# Patient Record
Sex: Female | Born: 1970 | Race: White | Hispanic: No | Smoking: Current every day smoker
Health system: Southern US, Community
[De-identification: ages and names within clinical notes are randomized; demographics above are authoritative.]

## PROBLEM LIST (undated history)

## (undated) DIAGNOSIS — H35069 Retinal vasculitis, unspecified eye: Secondary | ICD-10-CM

## (undated) DIAGNOSIS — K589 Irritable bowel syndrome without diarrhea: Secondary | ICD-10-CM

## (undated) DIAGNOSIS — Z832 Family history of diseases of the blood and blood-forming organs and certain disorders involving the immune mechanism: Secondary | ICD-10-CM

## (undated) DIAGNOSIS — D45 Polycythemia vera: Secondary | ICD-10-CM

## (undated) DIAGNOSIS — I82409 Acute embolism and thrombosis of unspecified deep veins of unspecified lower extremity: Secondary | ICD-10-CM

## (undated) HISTORY — DX: Polycythemia vera: D45

## (undated) HISTORY — DX: Acute embolism and thrombosis of unspecified deep veins of unspecified lower extremity: I82.409

## (undated) HISTORY — DX: Irritable bowel syndrome without diarrhea: K58.9

## (undated) HISTORY — DX: Retinal vasculitis, unspecified eye: H35.069

## (undated) HISTORY — DX: Family history of diseases of the blood and blood-forming organs and certain disorders involving the immune mechanism: Z83.2

---

## 2010-08-05 ENCOUNTER — Ambulatory Visit (HOSPITAL_BASED_OUTPATIENT_CLINIC_OR_DEPARTMENT_OTHER): Payer: BC Managed Care – PPO | Admitting: Hematology & Oncology

## 2010-08-05 ENCOUNTER — Other Ambulatory Visit: Payer: Self-pay | Admitting: Hematology & Oncology

## 2010-08-05 DIAGNOSIS — I2699 Other pulmonary embolism without acute cor pulmonale: Secondary | ICD-10-CM

## 2010-08-05 DIAGNOSIS — D45 Polycythemia vera: Secondary | ICD-10-CM

## 2010-08-05 LAB — CBC WITH DIFFERENTIAL (CANCER CENTER ONLY)
BASO#: 0 10e3/uL (ref 0.0–0.2)
BASO%: 0.4 % (ref 0.0–2.0)
EOS%: 2.2 % (ref 0.0–7.0)
Eosinophils Absolute: 0.1 10e3/uL (ref 0.0–0.5)
HCT: 38.9 % (ref 34.8–46.6)
HGB: 13.7 g/dL (ref 11.6–15.9)
LYMPH#: 1.9 10e3/uL (ref 0.9–3.3)
LYMPH%: 36.4 % (ref 14.0–48.0)
MCH: 32.5 pg (ref 26.0–34.0)
MCHC: 35.2 g/dL (ref 32.0–36.0)
MCV: 92 fL (ref 81–101)
MONO#: 0.5 10e3/uL (ref 0.1–0.9)
MONO%: 10.2 % (ref 0.0–13.0)
NEUT#: 2.6 10e3/uL (ref 1.5–6.5)
NEUT%: 50.8 % (ref 39.6–80.0)
Platelets: 211 10e3/uL (ref 145–400)
RBC: 4.22 10e6/uL (ref 3.70–5.32)
RDW: 14.3 % (ref 11.1–15.7)
WBC: 5.1 10e3/uL (ref 3.9–10.0)

## 2010-08-05 LAB — CHCC SATELLITE - SMEAR

## 2010-08-05 LAB — PROTIME-INR (CHCC SATELLITE): Protime: 18 Seconds — ABNORMAL HIGH (ref 10.6–13.4)

## 2010-08-08 LAB — ERYTHROPOIETIN: Erythropoietin: 21.6 m[IU]/mL (ref 2.6–34.0)

## 2010-08-08 LAB — IRON AND TIBC: UIBC: 428 ug/dL

## 2010-08-12 ENCOUNTER — Encounter (HOSPITAL_BASED_OUTPATIENT_CLINIC_OR_DEPARTMENT_OTHER): Payer: BC Managed Care – PPO | Admitting: Hematology & Oncology

## 2010-08-12 ENCOUNTER — Other Ambulatory Visit: Payer: Self-pay | Admitting: Hematology & Oncology

## 2010-08-12 DIAGNOSIS — I2699 Other pulmonary embolism without acute cor pulmonale: Secondary | ICD-10-CM

## 2010-08-12 DIAGNOSIS — D45 Polycythemia vera: Secondary | ICD-10-CM

## 2010-08-12 LAB — CBC WITH DIFFERENTIAL (CANCER CENTER ONLY)
BASO#: 0 10*3/uL (ref 0.0–0.2)
Eosinophils Absolute: 0.1 10*3/uL (ref 0.0–0.5)
HCT: 37.2 % (ref 34.8–46.6)
LYMPH%: 31.6 % (ref 14.0–48.0)
MCH: 32.3 pg (ref 26.0–34.0)
MCV: 92 fL (ref 81–101)
MONO%: 8.6 % (ref 0.0–13.0)
NEUT%: 58.4 % (ref 39.6–80.0)
Platelets: 181 10*3/uL (ref 145–400)
RBC: 4.05 10*6/uL (ref 3.70–5.32)

## 2010-09-16 ENCOUNTER — Ambulatory Visit: Payer: BC Managed Care – PPO | Admitting: Family Medicine

## 2010-09-16 DIAGNOSIS — Z029 Encounter for administrative examinations, unspecified: Secondary | ICD-10-CM

## 2010-10-12 ENCOUNTER — Other Ambulatory Visit: Payer: Self-pay | Admitting: Hematology & Oncology

## 2010-10-12 ENCOUNTER — Encounter (HOSPITAL_BASED_OUTPATIENT_CLINIC_OR_DEPARTMENT_OTHER): Payer: BC Managed Care – PPO | Admitting: Hematology & Oncology

## 2010-10-12 DIAGNOSIS — I2699 Other pulmonary embolism without acute cor pulmonale: Secondary | ICD-10-CM

## 2010-10-12 DIAGNOSIS — D45 Polycythemia vera: Secondary | ICD-10-CM

## 2010-10-12 DIAGNOSIS — D6859 Other primary thrombophilia: Secondary | ICD-10-CM

## 2010-10-12 LAB — CBC WITH DIFFERENTIAL (CANCER CENTER ONLY)
BASO#: 0 10*3/uL (ref 0.0–0.2)
Eosinophils Absolute: 0.1 10*3/uL (ref 0.0–0.5)
HCT: 43.2 % (ref 34.8–46.6)
HGB: 15.4 g/dL (ref 11.6–15.9)
LYMPH%: 26.5 % (ref 14.0–48.0)
MCH: 32.4 pg (ref 26.0–34.0)
MCV: 91 fL (ref 81–101)
MONO#: 0.7 10*3/uL (ref 0.1–0.9)
MONO%: 6.6 % (ref 0.0–13.0)
RBC: 4.76 10*6/uL (ref 3.70–5.32)
WBC: 10.3 10*3/uL — ABNORMAL HIGH (ref 3.9–10.0)

## 2010-10-12 LAB — FERRITIN: Ferritin: 28 ng/mL (ref 10–291)

## 2010-10-14 ENCOUNTER — Encounter (HOSPITAL_BASED_OUTPATIENT_CLINIC_OR_DEPARTMENT_OTHER): Payer: BC Managed Care – PPO | Admitting: Hematology & Oncology

## 2010-10-14 DIAGNOSIS — D45 Polycythemia vera: Secondary | ICD-10-CM

## 2010-10-14 DIAGNOSIS — I2699 Other pulmonary embolism without acute cor pulmonale: Secondary | ICD-10-CM

## 2010-10-18 ENCOUNTER — Other Ambulatory Visit: Payer: Self-pay | Admitting: Hematology & Oncology

## 2010-10-18 DIAGNOSIS — Z86718 Personal history of other venous thrombosis and embolism: Secondary | ICD-10-CM

## 2010-10-20 ENCOUNTER — Other Ambulatory Visit (HOSPITAL_BASED_OUTPATIENT_CLINIC_OR_DEPARTMENT_OTHER): Payer: BC Managed Care – PPO

## 2010-10-26 ENCOUNTER — Ambulatory Visit (HOSPITAL_BASED_OUTPATIENT_CLINIC_OR_DEPARTMENT_OTHER)
Admission: RE | Admit: 2010-10-26 | Discharge: 2010-10-26 | Disposition: A | Discharge status: Home / Self Care | Payer: BC Managed Care – PPO | Source: Ambulatory Visit | Attending: Hematology & Oncology | Admitting: Hematology & Oncology

## 2010-10-26 DIAGNOSIS — Z86718 Personal history of other venous thrombosis and embolism: Secondary | ICD-10-CM

## 2010-10-26 DIAGNOSIS — M79609 Pain in unspecified limb: Secondary | ICD-10-CM | POA: Insufficient documentation

## 2010-10-26 DIAGNOSIS — M7989 Other specified soft tissue disorders: Secondary | ICD-10-CM | POA: Insufficient documentation

## 2010-11-18 ENCOUNTER — Other Ambulatory Visit: Payer: Self-pay | Admitting: Hematology & Oncology

## 2010-11-18 ENCOUNTER — Encounter (HOSPITAL_BASED_OUTPATIENT_CLINIC_OR_DEPARTMENT_OTHER): Payer: BC Managed Care – PPO | Admitting: Hematology & Oncology

## 2010-11-18 DIAGNOSIS — Z86718 Personal history of other venous thrombosis and embolism: Secondary | ICD-10-CM

## 2010-11-18 DIAGNOSIS — D45 Polycythemia vera: Secondary | ICD-10-CM

## 2010-11-18 DIAGNOSIS — H35359 Cystoid macular degeneration, unspecified eye: Secondary | ICD-10-CM

## 2010-11-18 DIAGNOSIS — I2699 Other pulmonary embolism without acute cor pulmonale: Secondary | ICD-10-CM

## 2010-11-18 DIAGNOSIS — D6859 Other primary thrombophilia: Secondary | ICD-10-CM

## 2010-11-18 LAB — CBC WITH DIFFERENTIAL (CANCER CENTER ONLY)
BASO#: 0 10*3/uL (ref 0.0–0.2)
BASO%: 0.4 % (ref 0.0–2.0)
Eosinophils Absolute: 0.1 10*3/uL (ref 0.0–0.5)
HCT: 40.3 % (ref 34.8–46.6)
HGB: 14.2 g/dL (ref 11.6–15.9)
LYMPH#: 2.3 10*3/uL (ref 0.9–3.3)
LYMPH%: 24 % (ref 14.0–48.0)
MCV: 95 fL (ref 81–101)
MONO#: 0.7 10*3/uL (ref 0.1–0.9)
NEUT%: 67.1 % (ref 39.6–80.0)
RBC: 4.25 10*6/uL (ref 3.70–5.32)
RDW: 15.7 % (ref 11.1–15.7)
WBC: 9.6 10*3/uL (ref 3.9–10.0)

## 2010-11-18 LAB — PROTIME-INR (CHCC SATELLITE)

## 2010-11-21 ENCOUNTER — Encounter: Payer: Self-pay | Admitting: *Deleted

## 2010-11-22 ENCOUNTER — Telehealth: Payer: Self-pay | Admitting: Hematology & Oncology

## 2010-11-22 ENCOUNTER — Other Ambulatory Visit: Payer: Self-pay | Admitting: Hematology & Oncology

## 2010-11-22 ENCOUNTER — Ambulatory Visit (HOSPITAL_BASED_OUTPATIENT_CLINIC_OR_DEPARTMENT_OTHER)
Admission: RE | Admit: 2010-11-22 | Discharge: 2010-11-22 | Disposition: A | Discharge status: Home / Self Care | Payer: BC Managed Care – PPO | Source: Ambulatory Visit | Attending: Hematology & Oncology | Admitting: Hematology & Oncology

## 2010-11-22 ENCOUNTER — Ambulatory Visit (HOSPITAL_BASED_OUTPATIENT_CLINIC_OR_DEPARTMENT_OTHER): Payer: BC Managed Care – PPO

## 2010-11-22 VITALS — BP 108/72 | HR 87 | Temp 98.0°F

## 2010-11-22 DIAGNOSIS — R05 Cough: Secondary | ICD-10-CM

## 2010-11-22 DIAGNOSIS — R059 Cough, unspecified: Secondary | ICD-10-CM | POA: Insufficient documentation

## 2010-11-22 DIAGNOSIS — Z111 Encounter for screening for respiratory tuberculosis: Secondary | ICD-10-CM

## 2010-11-22 DIAGNOSIS — Z87891 Personal history of nicotine dependence: Secondary | ICD-10-CM | POA: Insufficient documentation

## 2010-11-22 MED ORDER — TUBERCULIN PPD 5 UNIT/0.1ML ID SOLN
5.0000 [IU] | Freq: Once | INTRADERMAL | Status: AC
  Administered 2010-11-22: 5 [IU] via INTRADERMAL

## 2010-11-22 NOTE — Telephone Encounter (Signed)
Entered in error

## 2010-11-22 NOTE — Progress Notes (Signed)
CC:   Lelon Perla, DO [name], M.D. Thayer Ohm, MD, Fax [phone]  DIAGNOSES: 1. Polycythemia vera. 2. Recurrent deep venous thrombosis/pulmonary embolus. 3. Factor V Leiden mutation.  CURRENT THERAPY:  Coumadin to maintain INR between 2-2.5.  INTERIM HISTORY:  [name] comes in for followup.  She is doing alright.  She has seen Dr. Luciana Axe.  He is a retinal specialist.  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  He wants to have numerous tests for this.  This, in my opinion, is not at all related to her polycythemia.  She is feeling that she has a "chest cold."  She is coughing up some purulent mucus.  She has a little bit of a temperature today.  We will go ahead and get a chest x-ray on her.  I am going to put her on a Z-Pak prescription.  She has had no chills.  There has been no bleeding.  There has been no change in bowel or bladder habits.  PHYSICAL EXAMINATION:  General Appearance:  This is a well-developed, well-nourished, white female in no obvious distress.  Vital Signs 100.3. Pulse 96.  Respiratory rate 18.  Blood pressure 104/68.  Weight is 124. Head and Neck Exam:  A normocephalic, atraumatic skull.  There are no ocular or oral lesions.  There are no palpable cervical or supraclavicular lymph nodes.  Lungs:  Clear bilaterally.  Cardiac Examination:  Regular rate and rhythm with normal S1, S2.  There are no murmurs, rubs, or bruits.  Abdominal Exam:  Soft with good bowel sounds. There is no palpable abdominal mass.  There is no fluid wave.  There is no palpable hepatosplenomegaly.  Back Exam:  No tenderness of the spine, ribs, or hips.  Extremities:  No clubbing, cyanosis, or edema. Neurological Exam:  No focal neurological deficits.  LABORATORY STUDIES:  White cell count 9.6, hemoglobin 14.2, hematocrit 40.3, platelet count 195.  Doppler test done on her legs on 10/26/2010 did not show any evidence of DVTs bilaterally.  IMPRESSION:  Ms.  Holly Shaffer is a very nice, 40 year old, white female with polycythemia.  She is JAK2 negative.  She also has factor V Leiden mutation.  Her INR is quite low today.  her INR is only 1.3.  We will have her take 5 mg of Coumadin a day.  We will go ahead and see what the chest x-ray shows.  Dr. Luciana Axe wants a lot of blood tests done for the cystoid macular edema.  We will get these blood tests.  He also wants her to have a TB test done.  We will do this on 11/21/2010.  I will plan to get her back in 2 weeks just to have an INR check.  We will then get her back in 6 weeks or so for additional lab work.  I will plan to get her back in after the holidays.    ______________________________ Holly Shaffer, M.D. PRE/MEDQ  D:  11/18/2010  T:  11/21/2010  Job:  [id number]

## 2010-11-22 NOTE — Patient Instructions (Signed)
Informed pt she needs to return in 48 to 72 hours to have her PPD read. She verbalized understanding.

## 2010-12-02 ENCOUNTER — Other Ambulatory Visit (HOSPITAL_BASED_OUTPATIENT_CLINIC_OR_DEPARTMENT_OTHER): Payer: BC Managed Care – PPO | Admitting: Lab

## 2010-12-02 ENCOUNTER — Other Ambulatory Visit: Payer: Self-pay | Admitting: Hematology & Oncology

## 2010-12-02 DIAGNOSIS — D6859 Other primary thrombophilia: Secondary | ICD-10-CM

## 2010-12-02 DIAGNOSIS — I2699 Other pulmonary embolism without acute cor pulmonale: Secondary | ICD-10-CM

## 2010-12-02 LAB — PROTIME-INR (CHCC SATELLITE): INR: 2.4 (ref 2.0–3.5)

## 2010-12-30 ENCOUNTER — Other Ambulatory Visit: Payer: BC Managed Care – PPO | Admitting: Lab

## 2010-12-30 ENCOUNTER — Other Ambulatory Visit: Payer: Self-pay | Admitting: Hematology & Oncology

## 2010-12-30 LAB — CBC WITH DIFFERENTIAL (CANCER CENTER ONLY)
BASO#: 0 10*3/uL (ref 0.0–0.2)
EOS%: 0.1 % (ref 0.0–7.0)
HCT: 41.9 % (ref 34.8–46.6)
HGB: 14.6 g/dL (ref 11.6–15.9)
LYMPH#: 1.4 10*3/uL (ref 0.9–3.3)
MCHC: 34.8 g/dL (ref 32.0–36.0)
MONO#: 0.3 10*3/uL (ref 0.1–0.9)
NEUT#: 14.7 10*3/uL — ABNORMAL HIGH (ref 1.5–6.5)
NEUT%: 89.4 % — ABNORMAL HIGH (ref 39.6–80.0)
RBC: 4.29 10*6/uL (ref 3.70–5.32)
WBC: 16.4 10*3/uL — ABNORMAL HIGH (ref 3.9–10.0)

## 2010-12-30 LAB — PROTIME-INR (CHCC SATELLITE): INR: 1.7 — ABNORMAL LOW (ref 2.0–3.5)

## 2011-01-04 ENCOUNTER — Other Ambulatory Visit: Payer: Self-pay | Admitting: *Deleted

## 2011-01-04 DIAGNOSIS — G4700 Insomnia, unspecified: Secondary | ICD-10-CM

## 2011-01-04 DIAGNOSIS — I82409 Acute embolism and thrombosis of unspecified deep veins of unspecified lower extremity: Secondary | ICD-10-CM

## 2011-01-04 MED ORDER — WARFARIN SODIUM 5 MG PO TABS: 5.0000 mg | ORAL_TABLET | Freq: Every day | ORAL | Status: DC

## 2011-01-04 MED ORDER — ZOLPIDEM TARTRATE 10 MG PO TABS: 10.0000 mg | ORAL_TABLET | Freq: Every evening | ORAL | Status: DC | PRN

## 2011-01-04 NOTE — Telephone Encounter (Signed)
Chart opened to refill long term medication.

## 2011-01-12 ENCOUNTER — Encounter: Payer: Self-pay | Admitting: *Deleted

## 2011-01-12 NOTE — Progress Notes (Signed)
Pt left message on voicemail with c/o of oral thrush and wants to know if Dr Myna Hidalgo can call in Nystatin in for her. It started after she began Prednisone 60 mg daily. Reviewed with Dr Myna Hidalgo, he doesn't recall placing her on Prednisone therefore she should call the MD who did. Spoke to pt. She said Dr Luciana Axe put her on Prednisone but "always wants to refer me back to Dr Myna Hidalgo for issues". Explained that it was common practice to have the physician who prescribed the medication mange the side effects. Also encouraged her to obtain a PCP. She verbalized understanding and will call Dr Luciana Axe.

## 2011-01-26 ENCOUNTER — Encounter: Payer: Self-pay | Admitting: Hematology & Oncology

## 2011-01-26 ENCOUNTER — Ambulatory Visit (HOSPITAL_BASED_OUTPATIENT_CLINIC_OR_DEPARTMENT_OTHER): Payer: BC Managed Care – PPO | Admitting: Hematology & Oncology

## 2011-01-26 ENCOUNTER — Other Ambulatory Visit (HOSPITAL_BASED_OUTPATIENT_CLINIC_OR_DEPARTMENT_OTHER): Payer: BC Managed Care – PPO | Admitting: Lab

## 2011-01-26 ENCOUNTER — Other Ambulatory Visit: Payer: Self-pay | Admitting: Hematology & Oncology

## 2011-01-26 DIAGNOSIS — I82409 Acute embolism and thrombosis of unspecified deep veins of unspecified lower extremity: Secondary | ICD-10-CM

## 2011-01-26 DIAGNOSIS — D6859 Other primary thrombophilia: Secondary | ICD-10-CM

## 2011-01-26 DIAGNOSIS — Z832 Family history of diseases of the blood and blood-forming organs and certain disorders involving the immune mechanism: Secondary | ICD-10-CM

## 2011-01-26 DIAGNOSIS — D45 Polycythemia vera: Secondary | ICD-10-CM | POA: Insufficient documentation

## 2011-01-26 DIAGNOSIS — H35069 Retinal vasculitis, unspecified eye: Secondary | ICD-10-CM

## 2011-01-26 HISTORY — DX: Polycythemia vera: D45

## 2011-01-26 HISTORY — DX: Acute embolism and thrombosis of unspecified deep veins of unspecified lower extremity: I82.409

## 2011-01-26 HISTORY — DX: Family history of diseases of the blood and blood-forming organs and certain disorders involving the immune mechanism: Z83.2

## 2011-01-26 HISTORY — DX: Retinal vasculitis, unspecified eye: H35.069

## 2011-01-26 LAB — CBC WITH DIFFERENTIAL (CANCER CENTER ONLY)
BASO#: 0 10*3/uL (ref 0.0–0.2)
BASO%: 0.1 % (ref 0.0–2.0)
EOS%: 0 % (ref 0.0–7.0)
HGB: 15.1 g/dL (ref 11.6–15.9)
LYMPH#: 0.7 10*3/uL — ABNORMAL LOW (ref 0.9–3.3)
MCHC: 35 g/dL (ref 32.0–36.0)
MONO#: 0.3 10*3/uL (ref 0.1–0.9)
NEUT#: 14.8 10*3/uL — ABNORMAL HIGH (ref 1.5–6.5)
RDW: 14.9 % (ref 11.1–15.7)
WBC: 15.8 10*3/uL — ABNORMAL HIGH (ref 3.9–10.0)

## 2011-01-26 LAB — PROTIME-INR (CHCC SATELLITE): INR: 1.5 — ABNORMAL LOW (ref 2.0–3.5)

## 2011-01-26 NOTE — Progress Notes (Signed)
This office note has been dictated.

## 2011-01-27 NOTE — Progress Notes (Signed)
CC:   Lelon Perla, DO [name], M.D. Thayer Ohm, M.D.  DIAGNOSES: 1. Polycythemia vera, JAK2 negative. 2. Recurrent deep vein thrombosis/pulmonary embolism. 3. Factor V Leiden mutation. 4. Retinal vasculitis.  CURRENT THERAPY: 1. Coumadin to maintain INR between 2 and 2.5. 2. Phlebotomy to maintain hematocrit less than 45%. 3. Steroids per Ophthalmology.  INTERIM HISTORY:  Holly Shaffer came in for followup.  She now is on the steroids.  She has retinal vasculitis.  She has cystoid macular edema. Dr. Luciana Axe of Ophthalmology is managing this.  He has her on prednisone. She is on 60 mg a day.  She is not happy about being on the prednisone. She feels very "bloated."  When I saw her, she had a cough.  She does smoke.  We did do a chest x- ray on her.  The chest x-ray was unremarkable.  She has some peribronchial thickening consistent with bronchitis.  We gave her a Z- Pak which did help her.  She is taking her Coumadin daily.  She alternates 5 mg with 2.5 mg.  Her INR today was only 1.5.  I told her that she needed to go up to 5 mg a day of the Coumadin.  She has a good appetite.  The steroids have increased her appetite quite a bit.  She has had no chest pain.  There has been no headache.  She has had no nausea or vomiting.  There has been no fever.  She did develop thrush. She is on Mycelex troches.  She says these do not work too well.  I did give her some samples of Oravig.  PHYSICAL EXAM:  GENERAL:  This is a slightly cushingoid-appearing white female in no obvious distress.  Vital Signs:  Temperature of 99.2, pulses 102, respiratory rate 18, blood pressure 123/82.  Weight is 129. Head and Neck Exam:  Normocephalic, atraumatic skull.  There are no ocular or oral lesions.  There are no palpable cervical or supraclavicular lymph nodes.  Lungs:  Clear bilaterally.  Cardiac Exam: Regular rate and rhythm with normal S1, S2.  There are no murmurs, rubs, or bruits.   Abdominal Exam:  Soft with good bowel sounds.  There is no palpable abdominal mass.  There is no palpable hepatosplenomegaly. Extremities:  No clubbing, cyanosis, or edema.  She has good range motion of her joints.  Skin Exam:  No rashes, ecchymosis, or petechia. Neurological Exam:  No focal neurological deficits.  LABORATORY RESULTS:  White cell count is 15.8, hemoglobin 15.1, hematocrit 43.1, platelet count 209.  INR is 1.5.  IMPRESSION:  Holly Shaffer is a 41 year old white female with polycythemia vera.  She is hypercoagulable secondary to factor V Leiden mutation.  I suspect that the polycythemia also to some degree is making her hypercoagulable.  I am still not sure what this cystoid macular edema is about.  However, Dr. Luciana Axe is doing a good job of managing this.  Ms. Jergens is waiting for an appointment with one of the ophthalmologists at [facility] that Dr. Luciana Axe is setting her up with.  We do not have to do a phlebotomy on her today.  We will have her come back to see [place] in about a month.  She will increase her Coumadin up to 5 mg a day.  I do want to see her back in 1 month.    ______________________________ Josph Macho, M.D. PRE/MEDQ  D:  01/26/2011  T:  01/26/2011  Job:  [id number]

## 2011-01-30 ENCOUNTER — Telehealth: Payer: Self-pay | Admitting: *Deleted

## 2011-01-30 DIAGNOSIS — J329 Chronic sinusitis, unspecified: Secondary | ICD-10-CM

## 2011-01-30 MED ORDER — AMOXICILLIN-POT CLAVULANATE 600-42.9 MG/5ML PO SUSR: 600.0000 mg | Freq: Two times a day (BID) | ORAL | Status: DC

## 2011-01-30 NOTE — Telephone Encounter (Signed)
Pt called with c/o "nasal and chest congestion along with a sinus infection". Wants to know if Dr Myna Hidalgo will call something in for her but "not a pill that is too big". Reviewed with Dr Myna Hidalgo. To call in Augmentin susp. Returned call to pt with instructions. She understands that it will be in a liquid form and not pills. To call back if she has problems with the medication or if her sx worsen.

## 2011-02-28 ENCOUNTER — Other Ambulatory Visit (HOSPITAL_BASED_OUTPATIENT_CLINIC_OR_DEPARTMENT_OTHER): Payer: BC Managed Care – PPO | Admitting: Lab

## 2011-02-28 ENCOUNTER — Ambulatory Visit: Payer: BC Managed Care – PPO

## 2011-02-28 ENCOUNTER — Ambulatory Visit (HOSPITAL_BASED_OUTPATIENT_CLINIC_OR_DEPARTMENT_OTHER): Payer: BC Managed Care – PPO | Admitting: Hematology & Oncology

## 2011-02-28 DIAGNOSIS — D45 Polycythemia vera: Secondary | ICD-10-CM

## 2011-02-28 DIAGNOSIS — I82409 Acute embolism and thrombosis of unspecified deep veins of unspecified lower extremity: Secondary | ICD-10-CM

## 2011-02-28 DIAGNOSIS — Z832 Family history of diseases of the blood and blood-forming organs and certain disorders involving the immune mechanism: Secondary | ICD-10-CM

## 2011-02-28 DIAGNOSIS — B370 Candidal stomatitis: Secondary | ICD-10-CM

## 2011-02-28 DIAGNOSIS — H35069 Retinal vasculitis, unspecified eye: Secondary | ICD-10-CM

## 2011-02-28 DIAGNOSIS — D6859 Other primary thrombophilia: Secondary | ICD-10-CM

## 2011-02-28 DIAGNOSIS — N390 Urinary tract infection, site not specified: Secondary | ICD-10-CM

## 2011-02-28 LAB — PROTIME-INR (CHCC SATELLITE)
INR: 2.9 (ref 2.0–3.5)
Protime: 34.8 Seconds — ABNORMAL HIGH (ref 10.6–13.4)

## 2011-02-28 LAB — CBC WITH DIFFERENTIAL (CANCER CENTER ONLY)
BASO%: 0.2 % (ref 0.0–2.0)
EOS%: 0.5 % (ref 0.0–7.0)
LYMPH#: 3.3 10*3/uL (ref 0.9–3.3)
MCHC: 35.8 g/dL (ref 32.0–36.0)
NEUT#: 6.9 10*3/uL — ABNORMAL HIGH (ref 1.5–6.5)
Platelets: 225 10*3/uL (ref 145–400)
RDW: 15.2 % (ref 11.1–15.7)
WBC: 11.3 10*3/uL — ABNORMAL HIGH (ref 3.9–10.0)

## 2011-02-28 MED ORDER — FLUCONAZOLE 100 MG PO TABS: 100.0000 mg | ORAL_TABLET | Freq: Every day | ORAL | Status: AC

## 2011-02-28 MED ORDER — SULFAMETHOXAZOLE-TRIMETHOPRIM 800-160 MG PO TABS: 1.0000 | ORAL_TABLET | Freq: Two times a day (BID) | ORAL | Status: AC

## 2011-02-28 NOTE — Progress Notes (Signed)
This office note has been dictated.

## 2011-02-28 NOTE — Progress Notes (Signed)
Pt accessed with #18 gague needle.  Blood will not flow.  Pt states she does not want to be stuck again today.  She will return tomorrow for the phlebotomy.

## 2011-03-01 ENCOUNTER — Telehealth: Payer: Self-pay | Admitting: Hematology & Oncology

## 2011-03-01 NOTE — Progress Notes (Signed)
CC:   Holly Perla, DO Holly Shaffer, M.D. Holly Ohm, MD, Fax [phone] Holly Pimple, MD  DIAGNOSES: 1. Polycythemia vera-JAK2 negative. 2. Recurrent deep venous thrombosis/pulmonary embolism. 3. Factor V Leiden mutation. 4. Retinal vasculitis.  CURRENT THERAPY: 1. Coumadin to maintain INR between 2-2.5. 2. Phlebotomy to maintain hematocrit less than 45%. 3. Steroids-currently on a taper.  INTERIM HISTORY:  Holly Shaffer comes in for followup.  She is not a "happy camper" today.  She is on steroids.  She does not like being on the steroids.  She just feels very bloated.  She is on 5 mg a day now.  She is going to be referred to Dr. Everlena Cooper at [facility].  This is for this retinal issue that she has.  She is getting injections into her eye for this vasculitis.  She I think is still smoking.  She is trying to cut back.  It is tough for her while she is on the steroids.  There has been no bleeding.  She has had no fever.  She has had a little bit of a temperature.  She did have some hematuria.  She denies any kind of dysuria.  PHYSICAL EXAM:  This is a well-developed, well-nourished white female in no obvious distress.  Vital signs:  100.2, pulse 102, respiratory rate 14, blood pressure 132/96, weight is 125.  Head and neck: Normocephalic, atraumatic skull.  There are no ocular or oral lesions. There are no palpable cervical or supraclavicular lymph nodes.  Lungs: Clear to percussion and auscultation bilaterally.  Cardiac:  Regular rate and rhythm with normal S1, S2.  There are no murmurs, rubs or bruits.  Abdomen:  Soft, she has a little bit of distention, mostly from I think abdominal fat accumulation of steroids.  There is no palpable abdominal mass.  There is no palpable hepatosplenomegaly.  Back:  No tenderness of the spine, ribs, or hips.  Extremities:  No clubbing, cyanosis or edema.  No palpable venous cords noted in the legs. Neurological:  No focal  neurological deficits.  LABORATORY STUDIES:  White cell count is 11.3, hemoglobin 15.6, hematocrit 43.6, platelet count is 225.  Her INR is 2.9.  IMPRESSION:  Holly Shaffer is a 41 year old white female with multiple issues.  She has polycythemia.  She has Factor V Leiden mutation- heterozygote.  She also has retinal vasculitis.  We will go ahead and phlebotomize her today.  She says that she just feels a little bit dizzy.  She says this happens when her blood gets "too thick".  I will take out 1 unit.  Will also give her 500 mL of fluid.  She does have a temperature today.  I am checking her urine for an infection.  I forgot to mention that she does have some thrush on her tongue.  I am going to put her on some Diflucan.  I think 100 mg a day while on steroids would be a good thing for her.  I am also going to put her on some Bactrim prophylactically.  Will put on Bactrim DS twice a day for 5 days.  I told Holly Shaffer that both these antibiotics could increase her Coumadin effect.  I want her to come in next week to have her Coumadin checked.  I will plan to get Holly Shaffer back in about a month for close followup.  I just feel bad for Holly Shaffer.  She really has a busy job and just does not have the "  time" to deal with all these other issues.    ______________________________ Josph Macho, M.D. PRE/MEDQ  D:  02/28/2011  T:  02/28/2011  Job:  [id number]

## 2011-03-01 NOTE — Telephone Encounter (Signed)
Patient called and cx 03/01/11 apt due to arm being sore.  She resch for 03/06/11

## 2011-03-23 ENCOUNTER — Other Ambulatory Visit: Payer: BC Managed Care – PPO | Admitting: Lab

## 2011-03-23 ENCOUNTER — Ambulatory Visit: Payer: BC Managed Care – PPO | Admitting: Hematology & Oncology

## 2011-03-28 ENCOUNTER — Encounter: Payer: Self-pay | Admitting: *Deleted

## 2011-03-28 NOTE — Progress Notes (Signed)
Pt left message on answering machine that she still has a "kidney infection" and she would like for Dr. Myna Hidalgo to give her another antibiotic, " one that is not too hard to swallow".  Per Dr. Myna Hidalgo, pt called, left message on pt's cell phone that the person that treated her first infection is the person she really needs to be calling.  If she wants to she can come in tomorrow and get a UA and culture.  Asked that she call us back and let us know if she is coming in tomorrow.

## 2011-04-07 ENCOUNTER — Other Ambulatory Visit: Payer: Self-pay | Admitting: *Deleted

## 2011-04-07 DIAGNOSIS — G4700 Insomnia, unspecified: Secondary | ICD-10-CM

## 2011-04-07 MED ORDER — ZOLPIDEM TARTRATE 10 MG PO TABS: 10.0000 mg | ORAL_TABLET | Freq: Every evening | ORAL | Status: DC | PRN

## 2011-04-07 NOTE — Telephone Encounter (Signed)
Received refill request for Ambien 10 mg. Reviewed records. Pt is within timeframe for refill. Called [facility] to [facility].

## 2011-04-19 ENCOUNTER — Ambulatory Visit: Payer: BC Managed Care – PPO

## 2011-04-19 ENCOUNTER — Ambulatory Visit (HOSPITAL_BASED_OUTPATIENT_CLINIC_OR_DEPARTMENT_OTHER): Payer: BC Managed Care – PPO | Admitting: Hematology & Oncology

## 2011-04-19 ENCOUNTER — Other Ambulatory Visit (HOSPITAL_BASED_OUTPATIENT_CLINIC_OR_DEPARTMENT_OTHER): Payer: BC Managed Care – PPO | Admitting: Lab

## 2011-04-19 VITALS — BP 132/87 | HR 108 | Temp 99.8°F | Ht 64.0 in | Wt 129.0 lb

## 2011-04-19 DIAGNOSIS — H35069 Retinal vasculitis, unspecified eye: Secondary | ICD-10-CM

## 2011-04-19 DIAGNOSIS — Z832 Family history of diseases of the blood and blood-forming organs and certain disorders involving the immune mechanism: Secondary | ICD-10-CM

## 2011-04-19 DIAGNOSIS — I82409 Acute embolism and thrombosis of unspecified deep veins of unspecified lower extremity: Secondary | ICD-10-CM

## 2011-04-19 DIAGNOSIS — D45 Polycythemia vera: Secondary | ICD-10-CM

## 2011-04-19 DIAGNOSIS — N390 Urinary tract infection, site not specified: Secondary | ICD-10-CM

## 2011-04-19 LAB — URINALYSIS, ROUTINE W REFLEX MICROSCOPIC
Bilirubin Urine: NEGATIVE
Glucose, UA: NEGATIVE mg/dL
Hgb urine dipstick: NEGATIVE
Ketones, ur: NEGATIVE mg/dL
Leukocytes, UA: NEGATIVE
Nitrite: NEGATIVE
Protein, ur: NEGATIVE mg/dL
Specific Gravity, Urine: 1.021 (ref 1.005–1.030)
Urobilinogen, UA: 1 mg/dL (ref 0.0–1.0)
pH: 6.5 (ref 5.0–8.0)

## 2011-04-19 LAB — CBC WITH DIFFERENTIAL (CANCER CENTER ONLY)
BASO#: 0 10e3/uL (ref 0.0–0.2)
BASO%: 0.2 % (ref 0.0–2.0)
EOS%: 0.3 % (ref 0.0–7.0)
Eosinophils Absolute: 0 10e3/uL (ref 0.0–0.5)
HCT: 43.4 % (ref 34.8–46.6)
HGB: 15.1 g/dL (ref 11.6–15.9)
LYMPH#: 1.7 10e3/uL (ref 0.9–3.3)
LYMPH%: 15.6 % (ref 14.0–48.0)
MCH: 36 pg — ABNORMAL HIGH (ref 26.0–34.0)
MCHC: 34.8 g/dL (ref 32.0–36.0)
MCV: 104 fL — ABNORMAL HIGH (ref 81–101)
MONO#: 0.6 10e3/uL (ref 0.1–0.9)
MONO%: 5.6 % (ref 0.0–13.0)
NEUT#: 8.8 10e3/uL — ABNORMAL HIGH (ref 1.5–6.5)
NEUT%: 78.3 % (ref 39.6–80.0)
Platelets: 236 10e3/uL (ref 145–400)
RBC: 4.19 10e6/uL (ref 3.70–5.32)
RDW: 13.4 % (ref 11.1–15.7)
WBC: 11.2 10e3/uL — ABNORMAL HIGH (ref 3.9–10.0)

## 2011-04-19 LAB — PROTIME-INR (CHCC SATELLITE): INR: 1.2 — ABNORMAL LOW (ref 2.0–3.5)

## 2011-04-19 MED ORDER — RIVAROXABAN 20 MG PO TABS: 20.0000 mg | ORAL_TABLET | Freq: Every day | ORAL | Status: DC

## 2011-04-19 NOTE — Progress Notes (Signed)
This office note has been dictated.

## 2011-04-19 NOTE — Progress Notes (Signed)
CC:   Holly Perla, DO [name], MD [name], M.D.  DIAGNOSES: 1. Polycythemia vera-JAK2 negative. 2. Recurrent deep venous thrombosis/pulmonary embolism secondary to     factor V Leiden mutation. 3. Retinal vasculitis.  CURRENT THERAPY: 1. The patient to be transitioned from Coumadin to Xarelto for ease of     anticoagulation therapy. 2. Phlebotomy to maintain hematocrit less than 45%.  INTERVAL HISTORY:  Holly Shaffer comes in for followup.  She is still having issues with this vasculitis.  She sees Dr. Everlena Cooper at [facility].  He has her on a steroid taper.  She tried this before.  Unfortunately, she had a flare up.  As such, she is on another steroid taper.  She really hates being on prednisone.  It just makes her feel bloated. She is also smoking because the prednisone just really gets her irritable.  She is trying to cut back.  She has Chantix but does not want to take this until she is off the prednisone.  She has had no headache.  She is working full time.  Her appetite is okay.  She has had some changes in her bowel and bladder habits.  There is some urinary frequency.  She has had no rashes.  She has had no pruritus.  PHYSICAL EXAM:  General: This is a slightly cushingoid-appearing white female in no obvious distress.  Vital signs: Temperature 99.8, pulse 108, respiratory rate 18, blood pressure is 132/87, and weight is 129. Head and neck exam shows a normocephalic, atraumatic skull.  There are no ocular or oral lesions.  Again, she has a slight cushingoid appearance to her face.  There is no adenopathy in her neck.  Lungs: Clear bilaterally.  Cardiac examination:  Regular rhythm with a normal S1 and S2.  There are no murmurs, rubs, or bruits.  Abdominal exam: Soft with good bowel sounds.  There is no palpable abdominal mass.  There is no palpable hepatosplenomegaly.  Extremities: Shows no clubbing, cyanosis, or edema.  She has good range of motion of her joints.   Skin exam: No rashes, ecchymosis, or petechia.  Neurological exam: Shows no focal neurological deficits.  LABORATORY STUDIES:  White cell count is 11, hemoglobin 15, hematocrit 43.4, and platelet count is 236. Her INR is 1.2.  IMPRESSION:  Holly Shaffer is a 41 year old white female whose main problem right now is retinal vasculitis.  It is unclear as to what this is coming from.  I would not think it is from the polycythemia.  I am going to switch her over to Xarelto.  Her INR is only 1.2.  She really is not happy with having to have her blood work done all the time.  She also is not too happy with having to watch what she eats.  I told her that Xarelto would be a good idea for her, as this has been shown to be as effective as Coumadin without the inconvenience.  I do not see a problem with her being on Xarelto.  We did go ahead and try to phlebotomize her today.  She says that she was a keeping her hemoglobin below 15.  We had difficulties with this. I think that were okay not having her phlebotomize, as her hematocrit was only 43.  We will plan to get her back to see [place] in another month or so.    ______________________________ Josph Macho, M.D. PRE/MEDQ  D:  04/19/2011  T:  04/19/2011  Job:  [id number]

## 2011-04-19 NOTE — Progress Notes (Signed)
Patient added on for phlebotomy, after one attempt per Holly Shaffer, patient refused to be re-cannulated. Patient pulled arm back when attempting to cannulate.  Dr. Myna Hidalgo notified.  Teola Bradley, Adriannah Steinkamp [employer]

## 2011-04-20 ENCOUNTER — Telehealth: Payer: Self-pay | Admitting: Hematology & Oncology

## 2011-04-20 LAB — IRON AND TIBC: TIBC: 368 ug/dL (ref 250–470)

## 2011-04-20 NOTE — Telephone Encounter (Signed)
Pt aware of 05-15-11 appointment 

## 2011-05-04 ENCOUNTER — Other Ambulatory Visit: Payer: Self-pay | Admitting: Hematology & Oncology

## 2011-05-04 DIAGNOSIS — N390 Urinary tract infection, site not specified: Secondary | ICD-10-CM

## 2011-05-04 DIAGNOSIS — J329 Chronic sinusitis, unspecified: Secondary | ICD-10-CM

## 2011-05-04 MED ORDER — AMOXICILLIN-POT CLAVULANATE 600-42.9 MG/5ML PO SUSR: 600.0000 mg | Freq: Two times a day (BID) | ORAL | Status: AC

## 2011-05-15 ENCOUNTER — Ambulatory Visit: Payer: BC Managed Care – PPO | Admitting: Hematology & Oncology

## 2011-05-15 ENCOUNTER — Other Ambulatory Visit: Payer: BC Managed Care – PPO | Admitting: Lab

## 2011-06-23 ENCOUNTER — Ambulatory Visit (HOSPITAL_BASED_OUTPATIENT_CLINIC_OR_DEPARTMENT_OTHER): Payer: BC Managed Care – PPO

## 2011-06-23 ENCOUNTER — Other Ambulatory Visit (HOSPITAL_BASED_OUTPATIENT_CLINIC_OR_DEPARTMENT_OTHER): Payer: BC Managed Care – PPO | Admitting: Lab

## 2011-06-23 ENCOUNTER — Ambulatory Visit (HOSPITAL_BASED_OUTPATIENT_CLINIC_OR_DEPARTMENT_OTHER): Payer: BC Managed Care – PPO | Admitting: Hematology & Oncology

## 2011-06-23 VITALS — BP 105/77 | HR 77 | Temp 97.0°F

## 2011-06-23 VITALS — BP 117/82 | HR 97 | Temp 98.2°F | Ht 64.0 in | Wt 127.0 lb

## 2011-06-23 DIAGNOSIS — I82409 Acute embolism and thrombosis of unspecified deep veins of unspecified lower extremity: Secondary | ICD-10-CM

## 2011-06-23 DIAGNOSIS — D45 Polycythemia vera: Secondary | ICD-10-CM

## 2011-06-23 DIAGNOSIS — I2699 Other pulmonary embolism without acute cor pulmonale: Secondary | ICD-10-CM

## 2011-06-23 DIAGNOSIS — D6859 Other primary thrombophilia: Secondary | ICD-10-CM

## 2011-06-23 DIAGNOSIS — Z832 Family history of diseases of the blood and blood-forming organs and certain disorders involving the immune mechanism: Secondary | ICD-10-CM

## 2011-06-23 DIAGNOSIS — H35069 Retinal vasculitis, unspecified eye: Secondary | ICD-10-CM

## 2011-06-23 LAB — CBC WITH DIFFERENTIAL (CANCER CENTER ONLY)
BASO#: 0 10*3/uL (ref 0.0–0.2)
BASO%: 0.3 % (ref 0.0–2.0)
HCT: 46.4 % (ref 34.8–46.6)
HGB: 16.5 g/dL — ABNORMAL HIGH (ref 11.6–15.9)
LYMPH#: 2.3 10*3/uL (ref 0.9–3.3)
MONO#: 0.5 10*3/uL (ref 0.1–0.9)
NEUT%: 57.6 % (ref 39.6–80.0)
RDW: 12.3 % (ref 11.1–15.7)
WBC: 7 10*3/uL (ref 3.9–10.0)

## 2011-06-23 NOTE — Progress Notes (Signed)
DIAGNOSES: 1. Polycythemia vera, JAK2-negative. 2. Factor V Leiden mutation, heterozygous, with recurrent deep vein     thrombosis/pulmonary embolus. 3. Retinal vasculitis.  CURRENT THERAPY: 1. Xarelto 20 mg p.o. daily. 2. Phlebotomy to maintain hemoglobin below 14. 3. Patient to start low-dose aspirin. 4. The patient is status post steroids for a cystoid macular edema.  INTERIM HISTORY:  Ms. Korell comes in for her followup.  She feels tired.  She probably needs to be phlebotomized.  She just has little energy.  She says that she gets some swelling in her legs.  She usually sees [place] when her blood count gets up a little bit too high.  She is on Xarelto.  She is tolerating Xarelto fairly well.  I think that we probably should consider low-dose aspirin for her.  I think this might be a good idea with respect to the arterial side of her circulation issues.  She has had no bleeding.  She has had no change in bowel or bladder habits.  She has had some issues with some dysuria.  She has had no rashes.  She has had no nausea.  She is still smoking but trying to cut back.  She is on Chantix.  On her last lab studies, her ferritin was 50 with an saturation of 38%. Her last D-dimer was 0.26.  PHYSICAL EXAMINATION:  This is a well-developed, well-nourished white female in no obvious distress.  Vital signs:  A temperature of 98.2, pulse 97, respiratory rate 18, blood pressure 117/82.  Weight is 127. Head and neck:  A normocephalic, atraumatic skull.  She has some facial plethora.  There is some slight conjunctival inflammation.  She has no adenopathy in her neck.  Lungs:  Clear bilaterally.  Cardiac:  Regular rate and rhythm with a normal S1 and S2.  There are no murmurs, rubs or bruits.  Abdomen:  Soft with good bowel sounds.  There is no palpable abdominal mass.  There is no fluid wave.  There is no palpable hepatosplenomegaly.  Extremities:  No clubbing, cyanosis or  edema. Neurologic:  No focal neurological deficits.  LABORATORY STUDIES:  White cell count is 17, hemoglobin 16.5,hematocrit 46.4, platelet count 199.  IMPRESSION:  Ms. Brancato is a 41 year old white female with polycythemia.  She also has a factor V Leiden mutation.  She is heterozygous for this.  She is on Xarelto.  We will go ahead and phlebotomize her today.  We will then have her come back in 1 week for another CBC and probable phlebotomy.  We will plan to see her back ourselves in about a month.    ______________________________ Josph Macho, M.D. PRE/MEDQ  D:  06/23/2011  T:  06/23/2011  Job:  [id number]

## 2011-06-23 NOTE — Progress Notes (Signed)
Holly Shaffer presents today for phlebotomy per MD orders. Phlebotomy procedure started at0925 and ended at 0940 Approximately 500 mls removed. Patient observed for 30 minutes after procedure without any incident. Patient tolerated procedure well. IV needle removed intact.

## 2011-06-23 NOTE — Progress Notes (Signed)
This office note has been dictated.

## 2011-06-23 NOTE — Patient Instructions (Signed)
Therapeutic Phlebotomy Therapeutic phlebotomy is the controlled removal of blood from your body for the purpose of treating a medical condition. It is similar to donating blood. Usually, about a pint (470 mL) of blood is removed. The average adult has 9 to 12 pints (4.3 to 5.7 L) of blood. Therapeutic phlebotomy may be used to treat the following medical conditions:  Hemochromatosis. This is a condition in which there is too much iron in the blood.   Polycythemia vera. This is a condition in which there are too many red cells in the blood.   Porphyria cutanea tarda. This is a disease usually passed from one generation to the next (inherited). It is a condition in which an important part of hemoglobin is not made properly. This results in the build up of abnormal amounts of porphyrins in the body.   Sickle cell disease. This is an inherited disease. It is a condition in which the red blood cells form an abnormal crescent shape rather than a round shape.  LET YOUR CAREGIVER KNOW ABOUT:  Allergies.   Medicines taken including herbs, eyedrops, over-the-counter medicines, and creams.   Use of steroids (by mouth or creams).   Previous problems with anesthetics or numbing medicine.   History of blood clots.   History of bleeding or blood problems.   Previous surgery.   Possibility of pregnancy, if this applies.  RISKS AND COMPLICATIONS This is a simple and safe procedure. Problems are unlikely. However, problems can occur and may include:  Nausea or lightheadedness.   Low blood pressure.   Soreness, bleeding, swelling, or bruising at the needle insertion site.   Infection.  BEFORE THE PROCEDURE  This is a procedure that can be done as an outpatient. Confirm the time that you need to arrive for your procedure. Confirm whether there is a need to fast or withhold any medications. It is helpful to wear clothing with sleeves that can be raised above the elbow. A blood sample may be done  to determine the amount of red blood cells or iron in your blood. Plan ahead of time to have someone drive you home after the procedure. PROCEDURE The entire procedure from preparation through recovery takes about 1 hour. The actual collection takes about 10 to 15 minutes.  A needle will be inserted into your vein.   Tubing and a collection bag will be attached to that needle.   Blood will flow through the needle and tubing into the collection bag.   You may be asked to open and close your hand slowly and continuously during the entire collection.   Once the specified amount of blood has been removed from your body, the collection bag and tubing will be clamped.   The needle will be removed.   Pressure will be held on the site of the needle insertion to stop the bleeding. Then a bandage will be placed over the needle insertion site.  AFTER THE PROCEDURE  Your recovery will be assessed and monitored. If there are no problems, as an outpatient, you should be able to go home shortly after the procedure.  Document Released: 06/06/2010 Document Revised: 12/22/2010 Document Reviewed: 06/06/2010 [employer] Patient Information 2012 [employer]. 

## 2011-06-24 LAB — IRON AND TIBC
Iron: 64 ug/dL (ref 42–145)
UIBC: 338 ug/dL (ref 125–400)

## 2011-06-29 ENCOUNTER — Other Ambulatory Visit: Payer: BC Managed Care – PPO | Admitting: Lab

## 2011-06-30 ENCOUNTER — Other Ambulatory Visit: Payer: BC Managed Care – PPO | Admitting: Lab

## 2011-07-12 ENCOUNTER — Other Ambulatory Visit: Payer: Self-pay | Admitting: *Deleted

## 2011-07-12 DIAGNOSIS — G4700 Insomnia, unspecified: Secondary | ICD-10-CM

## 2011-07-12 MED ORDER — ZOLPIDEM TARTRATE 10 MG PO TABS: 10.0000 mg | ORAL_TABLET | Freq: Every evening | ORAL | Status: DC | PRN

## 2011-07-12 NOTE — Telephone Encounter (Signed)
Received refill request for Ambien 10 mg. Reviewed records. Pt is within timeframe for refill. [facility] [phone].

## 2011-07-19 ENCOUNTER — Other Ambulatory Visit (HOSPITAL_BASED_OUTPATIENT_CLINIC_OR_DEPARTMENT_OTHER): Payer: BC Managed Care – PPO | Admitting: Lab

## 2011-07-19 ENCOUNTER — Ambulatory Visit (HOSPITAL_BASED_OUTPATIENT_CLINIC_OR_DEPARTMENT_OTHER): Payer: BC Managed Care – PPO

## 2011-07-19 ENCOUNTER — Ambulatory Visit (HOSPITAL_BASED_OUTPATIENT_CLINIC_OR_DEPARTMENT_OTHER): Payer: BC Managed Care – PPO | Admitting: Hematology & Oncology

## 2011-07-19 VITALS — BP 104/77 | HR 80 | Temp 97.5°F

## 2011-07-19 VITALS — BP 103/76 | HR 91 | Temp 98.7°F | Ht 64.0 in | Wt 124.0 lb

## 2011-07-19 DIAGNOSIS — Z832 Family history of diseases of the blood and blood-forming organs and certain disorders involving the immune mechanism: Secondary | ICD-10-CM

## 2011-07-19 DIAGNOSIS — I2699 Other pulmonary embolism without acute cor pulmonale: Secondary | ICD-10-CM

## 2011-07-19 DIAGNOSIS — Z7901 Long term (current) use of anticoagulants: Secondary | ICD-10-CM

## 2011-07-19 DIAGNOSIS — D45 Polycythemia vera: Secondary | ICD-10-CM

## 2011-07-19 DIAGNOSIS — D6859 Other primary thrombophilia: Secondary | ICD-10-CM

## 2011-07-19 DIAGNOSIS — H3552 Pigmentary retinal dystrophy: Secondary | ICD-10-CM

## 2011-07-19 LAB — CBC WITH DIFFERENTIAL (CANCER CENTER ONLY)
BASO%: 0.2 % (ref 0.0–2.0)
EOS%: 1.3 % (ref 0.0–7.0)
HCT: 46.8 % — ABNORMAL HIGH (ref 34.8–46.6)
LYMPH#: 2 10*3/uL (ref 0.9–3.3)
MCHC: 35.7 g/dL (ref 32.0–36.0)
MONO#: 0.7 10*3/uL (ref 0.1–0.9)
NEUT#: 6 10*3/uL (ref 1.5–6.5)
NEUT%: 67.7 % (ref 39.6–80.0)
Platelets: 215 10*3/uL (ref 145–400)
RDW: 12.6 % (ref 11.1–15.7)
WBC: 8.9 10*3/uL (ref 3.9–10.0)

## 2011-07-19 LAB — D-DIMER, QUANTITATIVE: D-Dimer, Quant: 0.27 ug/mL-FEU (ref 0.00–0.48)

## 2011-07-19 NOTE — Patient Instructions (Signed)

## 2011-07-19 NOTE — Progress Notes (Signed)
This office note has been dictated.

## 2011-07-19 NOTE — Progress Notes (Signed)
CC:   [name], MD Lelon Perla, DO [name], M.D.  DIAGNOSES: 1. Polycythemia vera, JAK2 negative. 2. Recurrent DVT, PE, heterozygous for factor V Leiden. 3. 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  CURRENT THERAPY: 1. Xarelto 20 mg p.o. daily. 2. Phlebotomy to maintain hemoglobin below 14. 3. Aspirin 81 mg p.o. daily.  INTERIM HISTORY:  [name] comes in for followup.  She still feels tired.  She feels she probably needs to be phlebotomized.  She was last phlebotomized about a month ago.  She is on Xarelto.  She is doing well with Xarelto.  We did start low- dose aspirin on her.  I felt this would be a safe and also very helpful with respect to any type of embolic issues on the "arterial side."  She is still working.  She is having a more difficult time at work because of the fatigue.  Her D-dimer was 0.27 last saw her a month ago.  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  She sees Dr. Everlena Cooper at [facility] for this.  From what Holly Shaffer says, he will be sending off some lab work on her to try to help better identified the source of this vasculitis within the eye.  She is still smoking.  She is trying to cut back.  PHYSICAL EXAMINATION:  This is a well-developed, well-nourished white female in no obvious distress.  Vital signs:  98.7, pulse 91, respiratory rate 18, blood pressure 103/76.  Weight is 124.  Head and neck:  Normocephalic, atraumatic skull.  There are no ocular or oral lesions.  There are no  palpable cervical or supraclavicular lymph nodes.  Lungs:  Clear bilaterally.  Cardiac:  Regular rate and rhythm with a normal S1 and S2.  There are no murmurs, rubs or bruits. Abdomen:  Soft with good bowel sounds.  There is no palpable abdominal mass.  There is no fluid wave.  There is no palpable hepatosplenomegaly. Back: No tenderness over the spine, ribs, or hips.  Extremities:  No clubbing, cyanosis or edema.  She has good range motion of her  joints. Skin:  Exam does show a ruddy complexion to her skin.  Neurological:  No focal neurological deficits.  LABORATORY STUDIES:  White cell count 8.9, hemoglobin 16.7, hematocrit 46.8, platelet count 215.  MCV is 100.  IMPRESSION:  Holly Shaffer is a 41 year old white female with polycythemia.  She also is hypercoagulable secondary to factor V Leiden mutation.  She is on lifelong Xarelto.  She just feels better with the hemoglobin below 14.  I will set her up with phlebotomies for 3 weeks in a row.  Hopefully, this will get her blood down and help improve rheology of her blood.  We will plan to get her back to see [place] in another 6 weeks.  Again, we will continue to be aggressive with phlebotomizing her.  Hopefully they will be able to help with this CME.    ______________________________ Josph Macho, M.D. PRE/MEDQ  D:  07/19/2011  T:  07/19/2011  Job:  [id number]

## 2011-07-19 NOTE — Progress Notes (Signed)
Holly Shaffer presents today for phlebotomy per MD orders. Phlebotomy procedure started at 0955 and ended at 1010.500 ml removed. Patient observed for 30 minutes after procedure without any incident. Patient tolerated procedure well. IV needle removed intact. See vital sign on doc flowsheets.

## 2011-07-21 ENCOUNTER — Ambulatory Visit: Payer: BC Managed Care – PPO | Admitting: Hematology & Oncology

## 2011-07-21 ENCOUNTER — Other Ambulatory Visit: Payer: BC Managed Care – PPO | Admitting: Lab

## 2011-07-27 ENCOUNTER — Ambulatory Visit: Payer: BC Managed Care – PPO

## 2011-07-27 NOTE — Progress Notes (Signed)
Holly Shaffer presents today for phlebotomy per MD orders. Phlebotomy procedure started at 1545, cannulated x 2, needle clotted both times.  Rescheduled for next week.  Instructed to take baby aspirin as ordered. Teola Bradley, Gabriele Zwilling [employer]

## 2011-07-27 NOTE — Patient Instructions (Signed)
[place] [address]  This test is done to determine whether you have an inherited gene mutation that increases your risk of developing venous blood clots. This test is done when you have had an unexplained clotting episode, especially when you are relatively young (less than 40 years old) and have no other identified risk factors. PREPARATION FOR TEST A blood sample is obtained by inserting a needle into a vein in the arm. NORMAL FINDINGS No genetic defect is found (negative). Ranges for normal findings may vary among different laboratories and hospitals. You should always check with your doctor after having lab work or other tests done to discuss the meaning of your test results and whether your values are considered within normal limits. MEANING OF TEST  Your caregiver will go over the test results with you and discuss the importance and meaning of your results, as well as treatment options and the need for additional tests if necessary. OBTAINING THE TEST RESULTS  It is your responsibility to obtain your test results. Ask the lab or department performing the test when and how you will get your results. Document Released: 02/05/2004 Document Revised: 12/22/2010 Document Reviewed: 12/13/2007 [facility] Patient Information 2012 [place], [place].Polycythemia Vera  Polycythemia Holly Shaffer is a condition in which the body makes too many red blood cells and there is no known cause. The red blood cells (erythrocytes) are the cells which carry the oxygen in your blood stream to the cells of your body. Because of the increased red blood cells, the blood becomes thicker and does not circulate as well. It would be similar to your car having oil which is too thick so it cannot start and circulate as well. When the blood is too thick it often causes headaches and dizziness. It may also cause blood clots. Even though the blood clots easier, these patients bleed easier. The bleeding is caused because the blood cells  which help stop bleeding (platelets) do not function normally. It occurs in all age groups but is more common in the 41 to 79 year age range. TREATMENT  The treatment of polycythemia vera for many years has been blood removal (phlebotomy) which is similar to blood removal in a blood bank, however this blood is not used for donation. Hydroxyurea is used to supplement phlebotomy. Aspirin is commonly given to thin the blood as long as the patient does not have a problem with bleeding. Other drugs are used based on the progression of the disease. Document Released: 09/27/2000 Document Revised: 12/22/2010 Document Reviewed: 04/03/2008 [facility] Patient Information 2012 [place], [place].

## 2011-08-01 ENCOUNTER — Ambulatory Visit (HOSPITAL_BASED_OUTPATIENT_CLINIC_OR_DEPARTMENT_OTHER): Payer: BC Managed Care – PPO

## 2011-08-01 VITALS — BP 97/74 | HR 77 | Temp 97.9°F

## 2011-08-01 DIAGNOSIS — D45 Polycythemia vera: Secondary | ICD-10-CM

## 2011-08-01 NOTE — Progress Notes (Signed)
Holly Shaffer presents today for phlebotomy per MD orders. Phlebotomy procedure started at 0845 and ended at 0855. 500 ml removed. Patient observed for 30 minutes after procedure without any incident. Patient tolerated procedure well. IV needle removed intact. See Doc Flowsheets for vitals sign

## 2011-08-01 NOTE — Patient Instructions (Signed)

## 2011-08-04 ENCOUNTER — Ambulatory Visit (HOSPITAL_BASED_OUTPATIENT_CLINIC_OR_DEPARTMENT_OTHER): Payer: BC Managed Care – PPO

## 2011-08-04 VITALS — BP 104/72 | HR 68

## 2011-08-04 DIAGNOSIS — I82409 Acute embolism and thrombosis of unspecified deep veins of unspecified lower extremity: Secondary | ICD-10-CM

## 2011-08-04 DIAGNOSIS — H35069 Retinal vasculitis, unspecified eye: Secondary | ICD-10-CM

## 2011-08-04 DIAGNOSIS — Z832 Family history of diseases of the blood and blood-forming organs and certain disorders involving the immune mechanism: Secondary | ICD-10-CM

## 2011-08-04 DIAGNOSIS — D45 Polycythemia vera: Secondary | ICD-10-CM

## 2011-08-04 MED ORDER — SODIUM CHLORIDE 0.9 % IV SOLN: Freq: Once | INTRAVENOUS | Status: DC

## 2011-08-04 NOTE — Patient Instructions (Addendum)
Therapeutic Phlebotomy Therapeutic phlebotomy is the controlled removal of blood from your body for the purpose of treating a medical condition. It is similar to donating blood. Usually, about a pint (470 mL) of blood is removed. The average adult has 9 to 12 pints (4.3 to 5.7 L) of blood. Therapeutic phlebotomy may be used to treat the following medical conditions:  Hemochromatosis. This is a condition in which there is too much iron in the blood.   Polycythemia vera. This is a condition in which there are too many red cells in the blood.   Porphyria cutanea tarda. This is a disease usually passed from one generation to the next (inherited). It is a condition in which an important part of hemoglobin is not made properly. This results in the build up of abnormal amounts of porphyrins in the body.   Sickle cell disease. This is an inherited disease. It is a condition in which the red blood cells form an abnormal crescent shape rather than a round shape.  LET YOUR CAREGIVER KNOW ABOUT:  Allergies.   Medicines taken including herbs, eyedrops, over-the-counter medicines, and creams.   Use of steroids (by mouth or creams).   Previous problems with anesthetics or numbing medicine.   History of blood clots.   History of bleeding or blood problems.   Previous surgery.   Possibility of pregnancy, if this applies.  RISKS AND COMPLICATIONS This is a simple and safe procedure. Problems are unlikely. However, problems can occur and may include:  Nausea or lightheadedness.   Low blood pressure.   Soreness, bleeding, swelling, or bruising at the needle insertion site.   Infection.  BEFORE THE PROCEDURE  This is a procedure that can be done as an outpatient. Confirm the time that you need to arrive for your procedure. Confirm whether there is a need to fast or withhold any medications. It is helpful to wear clothing with sleeves that can be raised above the elbow. A blood sample may be done  to determine the amount of red blood cells or iron in your blood. Plan ahead of time to have someone drive you home after the procedure. PROCEDURE The entire procedure from preparation through recovery takes about 1 hour. The actual collection takes about 10 to 15 minutes.  A needle will be inserted into your vein.   Tubing and a collection bag will be attached to that needle.   Blood will flow through the needle and tubing into the collection bag.   You may be asked to open and close your hand slowly and continuously during the entire collection.   Once the specified amount of blood has been removed from your body, the collection bag and tubing will be clamped.   The needle will be removed.   Pressure will be held on the site of the needle insertion to stop the bleeding. Then a bandage will be placed over the needle insertion site.  AFTER THE PROCEDURE  Your recovery will be assessed and monitored. If there are no problems, as an outpatient, you should be able to go home shortly after the procedure.  Document Released: 06/06/2010 Document Revised: 12/22/2010 Document Reviewed: 06/06/2010 [employer] Patient Information 2012 [employer]. 

## 2011-08-04 NOTE — Progress Notes (Signed)
Holly Shaffer presents today for phlebotomy per MD orders. Phlebotomy procedure started at 1445and ended at 1500.  500 cc removed. Patient tolerated procedure well. IV needle removed intact.

## 2011-09-01 ENCOUNTER — Telehealth: Payer: Self-pay | Admitting: Hematology & Oncology

## 2011-09-01 ENCOUNTER — Ambulatory Visit: Payer: BC Managed Care – PPO | Admitting: Medical

## 2011-09-01 ENCOUNTER — Other Ambulatory Visit: Payer: BC Managed Care – PPO | Admitting: Lab

## 2011-09-01 NOTE — Telephone Encounter (Signed)
Patient called to cancel appointment and stated they would call on Monday (09/04/11) to reschedule

## 2011-10-19 ENCOUNTER — Other Ambulatory Visit (HOSPITAL_BASED_OUTPATIENT_CLINIC_OR_DEPARTMENT_OTHER): Payer: BC Managed Care – PPO | Admitting: Lab

## 2011-10-19 ENCOUNTER — Ambulatory Visit (HOSPITAL_BASED_OUTPATIENT_CLINIC_OR_DEPARTMENT_OTHER): Payer: BC Managed Care – PPO | Admitting: Hematology & Oncology

## 2011-10-19 ENCOUNTER — Ambulatory Visit: Payer: BC Managed Care – PPO

## 2011-10-19 VITALS — BP 104/68 | HR 81 | Temp 98.1°F | Resp 18 | Ht 64.0 in | Wt 127.0 lb

## 2011-10-19 DIAGNOSIS — H35069 Retinal vasculitis, unspecified eye: Secondary | ICD-10-CM

## 2011-10-19 DIAGNOSIS — D45 Polycythemia vera: Secondary | ICD-10-CM

## 2011-10-19 DIAGNOSIS — Z832 Family history of diseases of the blood and blood-forming organs and certain disorders involving the immune mechanism: Secondary | ICD-10-CM

## 2011-10-19 DIAGNOSIS — I82409 Acute embolism and thrombosis of unspecified deep veins of unspecified lower extremity: Secondary | ICD-10-CM

## 2011-10-19 DIAGNOSIS — D869 Sarcoidosis, unspecified: Secondary | ICD-10-CM

## 2011-10-19 DIAGNOSIS — D6859 Other primary thrombophilia: Secondary | ICD-10-CM

## 2011-10-19 LAB — CBC WITH DIFFERENTIAL (CANCER CENTER ONLY)
BASO%: 0.3 % (ref 0.0–2.0)
HCT: 39.8 % (ref 34.8–46.6)
LYMPH%: 34.6 % (ref 14.0–48.0)
MCH: 30.3 pg (ref 26.0–34.0)
MCHC: 34.2 g/dL (ref 32.0–36.0)
MCV: 89 fL (ref 81–101)
MONO#: 0.7 10*3/uL (ref 0.1–0.9)
MONO%: 9.2 % (ref 0.0–13.0)
NEUT%: 53.8 % (ref 39.6–80.0)
Platelets: 225 10*3/uL (ref 145–400)
RDW: 16.3 % — ABNORMAL HIGH (ref 11.1–15.7)
WBC: 8 10*3/uL (ref 3.9–10.0)

## 2011-10-19 LAB — PROTIME-INR (CHCC SATELLITE): Protime: 13.2 Seconds (ref 10.6–13.4)

## 2011-10-19 NOTE — Progress Notes (Signed)
This office note has been dictated.

## 2011-10-19 NOTE — Progress Notes (Signed)
Hct 39.8, no phlebotomy per Dr. Myna Hidalgo. Holly Shaffer, Holly Shaffer [employer]

## 2011-10-20 NOTE — Progress Notes (Signed)
CC:   Holly Perla, DO [name], MD [name], M.D.  DIAGNOSES: 1. Polycythemia vera. 2. Recurrent deep vein thrombosis/pulmonary embolus secondary to     factor V Leiden mutation, heterozygote. 3. Autoimmune retinal vasculitis. 4. 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  CURRENT THERAPY: 1. Xarelto 20 mg p.o. daily. 2. Aspirin 81 mg p.o. daily. 3. Phlebotomy to maintain a hemoglobin below 14.  INTERIM HISTORY:  Holly Shaffer comes in for followup.  She is not doing too well.  She is really stiff.  She is being evaluated at [facility].  They want to start her on azathioprine.  I have no problems with this.  I do not see how this is going to be a negative for her.  This actually may help the polycythemia and slow down her marrow and decrease her hemoglobin.  She is also going to need to be on prednisone.  This is all for this autoimmune retinal issue that she has.  She was told that she may have sarcoid.  This, I think, would be a little unusual as sarcoid really is an [place] American disease, although there are some Caucasians who have it.  I think she is doing well with the Xarelto and aspirin.  I do not see that hypercoagulability is an issue right now.  She is smoking.  She is trying to cut back on this.  She is still working.  There is a lot of stress at work.  PHYSICAL EXAMINATION:  This is a well-developed, well-nourished white female in no obvious distress.  Vital signs:  Temperature 98.7, pulse 81, respiratory rate 18, blood pressure 104/68.  Weight is 127.  Head and neck:  Normocephalic, atraumatic skull.  There are no ocular or oral lesions.  There are no palpable cervical or supraclavicular lymph nodes. Lungs:  Clear bilaterally.  Cardiac:  Regular rate and rhythm with a normal S1 and S2.  There are no murmurs, rubs or bruits.  Abdomen:  Soft with good bowel sounds.  There is no palpable abdominal mass.  There is no fluid wave.  There is no palpable hepatosplenomegaly.  Back:   No tenderness over the spine, ribs, or hips.  Extremities:  No clubbing, cyanosis or edema.  She has good range of motion of her joints.  She has some stiffness in her arms and legs.  Neurologic:  No focal neurological deficits.  LABORATORY STUDIES:  White cell count 8, hemoglobin 13.6, hematocrit 39.8, platelet count 225.  IMPRESSION:  Holly Shaffer is a 41 year old white female with polycythemia.  Again, she is JAK2-negative.  She does not need be phlebotomized today.  Again, I would be amazed if she had sarcoid.  I guess may be sarcoid might explain some of this autoimmune stuff going on.  She will continue on the Xarelto and aspirin.  I think this is a good combination for her to help with hypercoagulability.  Again, I do not see any problems with her being on azathioprine.  I do want to see her back in a month.  I think we are going to have to stay in close contact with her while she is having these changes made to her medications.    ______________________________ Josph Macho, M.D. PRE/MEDQ  D:  10/19/2011  T:  10/20/2011  Job:  [id number]

## 2011-10-23 ENCOUNTER — Other Ambulatory Visit: Payer: Self-pay | Admitting: Hematology & Oncology

## 2011-10-26 ENCOUNTER — Other Ambulatory Visit: Payer: Self-pay | Admitting: Hematology & Oncology

## 2011-10-26 ENCOUNTER — Other Ambulatory Visit: Payer: Self-pay | Admitting: *Deleted

## 2011-10-30 ENCOUNTER — Other Ambulatory Visit: Payer: Self-pay | Admitting: Hematology & Oncology

## 2011-11-24 ENCOUNTER — Ambulatory Visit (HOSPITAL_BASED_OUTPATIENT_CLINIC_OR_DEPARTMENT_OTHER): Payer: BC Managed Care – PPO | Admitting: Hematology & Oncology

## 2011-11-24 ENCOUNTER — Other Ambulatory Visit (HOSPITAL_BASED_OUTPATIENT_CLINIC_OR_DEPARTMENT_OTHER): Payer: BC Managed Care – PPO | Admitting: Lab

## 2011-11-24 ENCOUNTER — Ambulatory Visit: Payer: BC Managed Care – PPO

## 2011-11-24 VITALS — BP 130/80 | HR 84 | Temp 98.3°F | Resp 18 | Ht 64.0 in | Wt 133.0 lb

## 2011-11-24 DIAGNOSIS — H35069 Retinal vasculitis, unspecified eye: Secondary | ICD-10-CM

## 2011-11-24 DIAGNOSIS — D6859 Other primary thrombophilia: Secondary | ICD-10-CM

## 2011-11-24 DIAGNOSIS — D45 Polycythemia vera: Secondary | ICD-10-CM

## 2011-11-24 DIAGNOSIS — Z7901 Long term (current) use of anticoagulants: Secondary | ICD-10-CM

## 2011-11-24 DIAGNOSIS — Z832 Family history of diseases of the blood and blood-forming organs and certain disorders involving the immune mechanism: Secondary | ICD-10-CM

## 2011-11-24 DIAGNOSIS — D869 Sarcoidosis, unspecified: Secondary | ICD-10-CM

## 2011-11-24 LAB — CHCC SATELLITE - SMEAR

## 2011-11-24 LAB — CBC WITH DIFFERENTIAL (CANCER CENTER ONLY)
BASO%: 0.1 % (ref 0.0–2.0)
LYMPH%: 26.4 % (ref 14.0–48.0)
MCH: 30.3 pg (ref 26.0–34.0)
MCHC: 34.1 g/dL (ref 32.0–36.0)
MCV: 89 fL (ref 81–101)
MONO%: 7.1 % (ref 0.0–13.0)
Platelets: 263 10*3/uL (ref 145–400)
RDW: 20.1 % — ABNORMAL HIGH (ref 11.1–15.7)

## 2011-11-24 NOTE — Progress Notes (Signed)
This office note has been dictated.

## 2011-11-24 NOTE — Progress Notes (Signed)
Patient not phlebotomized today because she had a meeting to go to.  Phlebotomy rescheduled to Monday 11/27/11

## 2011-11-25 NOTE — Progress Notes (Signed)
CC:   Holly Perla, DO Holly Pimple, MD Holly Shaffer, M.D.  DIAGNOSES: 1. Polycythemia vera. 2. Recurrent deep venous thrombosis/pulmonary embolism. 3. Heterozygote factor V Leiden mutation. 4. Autoimmune retinal vasculitis.  CURRENT THERAPY: 1. Xarelto 20 mg p.o. daily. 2. Aspirin 81 mg p.o. daily. 3. Phlebotomy to maintain hemoglobin below 14.  INTERIM HISTORY:  Holly Shaffer comes in for followup.  She is on quite a bit of steroids.  She is having visual problems.  She started at 60 mg a day of prednisone.  She said that her vision did get better.  She has not yet started azathioprine.  She is not sure she wants to start this.  She has had no headache.  She has had no cough.  She is still smoking but cutting back to less than a pack a day.  She is under a lot of stress at work.  She is actually trying to look for another job that may not be as stressful.  PHYSICAL EXAMINATION:  General:  This is a well-developed, well- nourished white female in no obvious distress.  She may be a little cushingoid.  Vital signs:  Show temperature 98.3, pulse 84, respiratory rate 18, blood pressure 130/80.  Weight is 133.  Head and neck:  Shows a normocephalic, atraumatic skull.  There are no ocular or oral lesions. There are no palpable cervical or supraclavicular lymph nodes.  Lungs: Clear bilaterally.  There are no rales, wheezes or rhonchi.  Cardiac: Regular rate and rhythm with a normal S1, S2.  There are no murmurs, rubs or bruits.  Abdomen:  Soft with good bowel sounds.  There is no palpable abdominal mass.  There is no palpable hepatosplenomegaly. Back:  No tenderness over the spine, ribs or hips.  Extremities:  Show no clubbing, cyanosis or edema.  LABORATORY STUDIES:  White cell count is 19 (but the patient is on prednisone), hemoglobin 14.5, hematocrit 42.5, platelet count 263.  MCV is 89.  IMPRESSION:  Holly Shaffer is a very nice 41 year old white female with multiple  hematologic issues.  She also has an autoimmune problem with her eyes.  She likes to have her hemoglobin below 14.  She cannot be phlebotomized today.  We will get this set up for next week.  She continues on the Xarelto and aspirin.  I believe that these are essential for her.  I am checking her for sarcoidosis.  I am sending off an ACE level on her.  We will plan to get her back in, in about a month or so.  We really need to keep close tabs on her while she is having all these active issues.    ______________________________ Josph Macho, M.D. PRE/MEDQ  D:  11/24/2011  T:  11/25/2011  Job:  [id number]

## 2011-12-07 ENCOUNTER — Ambulatory Visit (HOSPITAL_BASED_OUTPATIENT_CLINIC_OR_DEPARTMENT_OTHER): Payer: BC Managed Care – PPO

## 2011-12-07 VITALS — BP 136/80 | HR 88 | Temp 97.0°F | Resp 20

## 2011-12-07 DIAGNOSIS — H35069 Retinal vasculitis, unspecified eye: Secondary | ICD-10-CM

## 2011-12-07 DIAGNOSIS — I82409 Acute embolism and thrombosis of unspecified deep veins of unspecified lower extremity: Secondary | ICD-10-CM

## 2011-12-07 DIAGNOSIS — Z832 Family history of diseases of the blood and blood-forming organs and certain disorders involving the immune mechanism: Secondary | ICD-10-CM

## 2011-12-07 DIAGNOSIS — D45 Polycythemia vera: Secondary | ICD-10-CM

## 2011-12-07 NOTE — Patient Instructions (Signed)

## 2011-12-07 NOTE — Progress Notes (Signed)
Holly Shaffer presents today for phlebotomy per MD orders. Phlebotomy procedure started at 1251 and ended at 1257. Approximately 500 mls removed. Patient observed for 30 minutes after procedure without any incident. Patient tolerated procedure well. IV needle removed intact.

## 2011-12-25 ENCOUNTER — Ambulatory Visit: Payer: BC Managed Care – PPO | Admitting: Hematology & Oncology

## 2011-12-25 ENCOUNTER — Other Ambulatory Visit: Payer: BC Managed Care – PPO | Admitting: Lab

## 2011-12-25 ENCOUNTER — Telehealth: Payer: Self-pay | Admitting: Hematology & Oncology

## 2011-12-25 NOTE — Telephone Encounter (Signed)
Pt moved 12-9 to 12-13. She is aware phlebotomy is at 10:30 am

## 2011-12-26 ENCOUNTER — Telehealth: Payer: Self-pay | Admitting: Hematology & Oncology

## 2011-12-26 NOTE — Telephone Encounter (Signed)
Due to MD being out, i cx 12/29/11 apt and resch for 01/02/12.  i called patient and gave reason for cancellation and gave resch date/time of apt on 01/02/12.  Patient is aware of apt

## 2011-12-29 ENCOUNTER — Ambulatory Visit: Payer: BC Managed Care – PPO | Admitting: Hematology & Oncology

## 2011-12-29 ENCOUNTER — Other Ambulatory Visit: Payer: BC Managed Care – PPO | Admitting: Lab

## 2012-01-02 ENCOUNTER — Ambulatory Visit: Payer: BC Managed Care – PPO | Admitting: Medical

## 2012-01-02 ENCOUNTER — Ambulatory Visit (HOSPITAL_BASED_OUTPATIENT_CLINIC_OR_DEPARTMENT_OTHER): Payer: BC Managed Care – PPO | Admitting: Medical

## 2012-01-02 ENCOUNTER — Ambulatory Visit: Payer: BC Managed Care – PPO

## 2012-01-02 ENCOUNTER — Other Ambulatory Visit (HOSPITAL_BASED_OUTPATIENT_CLINIC_OR_DEPARTMENT_OTHER): Payer: BC Managed Care – PPO | Admitting: Lab

## 2012-01-02 ENCOUNTER — Other Ambulatory Visit: Payer: BC Managed Care – PPO | Admitting: Lab

## 2012-01-02 VITALS — BP 132/88 | HR 96 | Temp 98.6°F | Resp 16 | Wt 133.0 lb

## 2012-01-02 DIAGNOSIS — Z7901 Long term (current) use of anticoagulants: Secondary | ICD-10-CM

## 2012-01-02 DIAGNOSIS — Z86718 Personal history of other venous thrombosis and embolism: Secondary | ICD-10-CM

## 2012-01-02 DIAGNOSIS — D45 Polycythemia vera: Secondary | ICD-10-CM

## 2012-01-02 DIAGNOSIS — I82409 Acute embolism and thrombosis of unspecified deep veins of unspecified lower extremity: Secondary | ICD-10-CM

## 2012-01-02 DIAGNOSIS — Z86711 Personal history of pulmonary embolism: Secondary | ICD-10-CM

## 2012-01-02 LAB — CBC WITH DIFFERENTIAL (CANCER CENTER ONLY)
BASO%: 0.2 % (ref 0.0–2.0)
HCT: 39.4 % (ref 34.8–46.6)
LYMPH%: 28 % (ref 14.0–48.0)
MCHC: 34.5 g/dL (ref 32.0–36.0)
MCV: 95 fL (ref 81–101)
MONO#: 1 10*3/uL — ABNORMAL HIGH (ref 0.1–0.9)
NEUT%: 64.2 % (ref 39.6–80.0)
RDW: 16.4 % — ABNORMAL HIGH (ref 11.1–15.7)
WBC: 13.4 10*3/uL — ABNORMAL HIGH (ref 3.9–10.0)

## 2012-01-02 NOTE — Progress Notes (Signed)
No phlebotomy need today per m.d order

## 2012-01-02 NOTE — Progress Notes (Signed)
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 #3 heterozygote factor V Leiden mutation. #4 autoimmune retinal vasculitis.  Current therapy: #1 Xarelto 20 mg by mouth daily. #2 aspirin 81 mg by mouth daily. #3 phlebotomy to maintain hemoglobin less than 14.  Interim history: Holly Shaffer presents today for an office followup visit.  Overall, she, reports, that she's doing relatively well.  She still complains of some fatigue, but is able to perform her activities of daily living without any hindrance or decline.  She is on 10 mg of steroids.  She is also on a steroid eyedrop.  Her visual acuity has improved.  She remains on Xarelto without any problems.  She does not report any bleeding symptoms.  She has a good appetite.  She denies any nausea, vomiting, diarrhea, constipation, chest pain, shortness of breath, or cough.  She denies any fevers, chills, or night sweats.  She denies any obvious, abnormal bleeding.  She denies any headaches or rashes.  Her hemoglobin is 13.6.  As such, she will not need a phlebotomy today.  Of note, the last, time, we saw her.  We did send off an ACE level, which was normal at 15.  Review of Systems: Constitutional:Negative for malaise/fatigue, fever, chills, weight loss, diaphoresis, activity change, appetite change, and unexpected weight change.  HEENT: Negative for double vision, blurred vision, visual loss, ear pain, tinnitus, congestion, rhinorrhea, epistaxis sore throat or sinus disease, oral pain/lesion, tongue soreness Respiratory: Negative for cough, chest tightness, shortness of breath, wheezing and stridor.  Cardiovascular: Negative for chest pain, palpitations, leg swelling, orthopnea, PND, DOE or claudication Gastrointestinal: Negative for nausea, vomiting, abdominal pain, diarrhea, constipation, blood in stool, melena, hematochezia, abdominal distention, anal bleeding, rectal pain, anorexia and hematemesis.   Genitourinary: Negative for dysuria, frequency, hematuria,  Musculoskeletal: Negative for myalgias, back pain, joint swelling, arthralgias and gait problem.  Skin: Negative for rash, color change, pallor and wound.  Neurological:. Negative for dizziness/light-headedness, tremors, seizures, syncope, facial asymmetry, speech difficulty, weakness, numbness, headaches and paresthesias.  Hematological: Negative for adenopathy. Does not bruise/bleed easily.  Psychiatric/Behavioral:  Negative for depression, no loss of interest in normal activity or change in sleep pattern.   Physical Exam: This is a 41 year old, well-developed, well-nourished, white female, in no obvious distress Vitals: Temperature 90.6 degrees, pulse 96, respirations 16, blood pressure 132/80.  Weight 133 pounds HEENT reveals a normocephalic, atraumatic skull, no scleral icterus, no oral lesions  Neck is supple without any cervical or supraclavicular adenopathy.  Lungs are clear to auscultation bilaterally. There are no wheezes, rales or rhonci Cardiac is regular rate and rhythm with a normal S1 and S2. There are no murmurs, rubs, or bruits.  Abdomen is soft with good bowel sounds, there is no palpable mass. There is no palpable hepatosplenomegaly. There is no palpable fluid wave.  Musculoskeletal no tenderness of the spine, ribs, or hips.  Extremities there are no clubbing, cyanosis, or edema.  Skin no petechia, purpura or ecchymosis Neurologic is nonfocal.  Laboratory Data: White count 13.4, hemoglobin 13.6, hematocrit 39.4, platelets 279,000  Current Outpatient Prescriptions on File Prior to Visit  Medication Sig Dispense Refill  . predniSONE (DELTASONE) 10 MG tablet Take 10 mg by mouth daily. 11-24-11 started with 50 mg. Daily. 11-15- will be 40 mg. And taper down 10 mg. Every week.      Carlena Hurl 20 MG TABS TAKE 20 MG BY MOUTH DAILY AT 12 NOON.  30 tablet  6  . zolpidem (AMBIEN) 10  MG tablet TAKE 1 TABLET BY MOUTH AT  BEDTIME AS NEEDED FOR SLEEP  30 tablet  2   Assessment/Plan: This is a pleasant, 41 year old, white female, with the following issues:  #1.  Polycythemia vera.  Her hemoglobin is below 14.  Today, as, such, we will not phlebotomize her.  #2.  Recurrent deep venous thrombosis/pulmonary embolism.  She remains on Xarelto 20 mg daily  #3.  Autoimmune retinal vasculitis.  She remains on prednisone 10 mg daily.  She is also on eyedrops.  #4.  Followup.  We will follow back up with Holly Shaffer one month, but before then should there be questions or concerns.  Marland Kitchen

## 2012-01-31 ENCOUNTER — Other Ambulatory Visit: Payer: Self-pay | Admitting: Hematology & Oncology

## 2012-02-02 ENCOUNTER — Ambulatory Visit (HOSPITAL_BASED_OUTPATIENT_CLINIC_OR_DEPARTMENT_OTHER): Payer: BC Managed Care – PPO | Admitting: Medical

## 2012-02-02 ENCOUNTER — Ambulatory Visit: Payer: BC Managed Care – PPO

## 2012-02-02 ENCOUNTER — Other Ambulatory Visit (HOSPITAL_BASED_OUTPATIENT_CLINIC_OR_DEPARTMENT_OTHER): Payer: BC Managed Care – PPO | Admitting: Lab

## 2012-02-02 VITALS — BP 122/74 | HR 86 | Temp 98.4°F | Resp 16 | Wt 133.0 lb

## 2012-02-02 DIAGNOSIS — I82409 Acute embolism and thrombosis of unspecified deep veins of unspecified lower extremity: Secondary | ICD-10-CM

## 2012-02-02 DIAGNOSIS — Z86711 Personal history of pulmonary embolism: Secondary | ICD-10-CM

## 2012-02-02 DIAGNOSIS — Z832 Family history of diseases of the blood and blood-forming organs and certain disorders involving the immune mechanism: Secondary | ICD-10-CM

## 2012-02-02 DIAGNOSIS — D45 Polycythemia vera: Secondary | ICD-10-CM

## 2012-02-02 DIAGNOSIS — Z86718 Personal history of other venous thrombosis and embolism: Secondary | ICD-10-CM

## 2012-02-02 DIAGNOSIS — H35069 Retinal vasculitis, unspecified eye: Secondary | ICD-10-CM

## 2012-02-02 LAB — CBC WITH DIFFERENTIAL (CANCER CENTER ONLY)
BASO#: 0 10*3/uL (ref 0.0–0.2)
Eosinophils Absolute: 0.1 10*3/uL (ref 0.0–0.5)
HGB: 14.5 g/dL (ref 11.6–15.9)
MCH: 32.6 pg (ref 26.0–34.0)
MCV: 96 fL (ref 81–101)
MONO#: 0.6 10*3/uL (ref 0.1–0.9)
MONO%: 7.2 % (ref 0.0–13.0)
NEUT#: 5.4 10*3/uL (ref 1.5–6.5)
Platelets: 236 10*3/uL (ref 145–400)
RBC: 4.45 10*6/uL (ref 3.70–5.32)
WBC: 8.2 10*3/uL (ref 3.9–10.0)

## 2012-02-02 LAB — IRON AND TIBC
%SAT: 6 % — ABNORMAL LOW (ref 20–55)
Iron: 30 ug/dL — ABNORMAL LOW (ref 42–145)
TIBC: 475 ug/dL — ABNORMAL HIGH (ref 250–470)
UIBC: 445 ug/dL — ABNORMAL HIGH (ref 125–400)

## 2012-02-02 LAB — CHCC SATELLITE - SMEAR

## 2012-02-02 LAB — FERRITIN: Ferritin: 13 ng/mL (ref 10–291)

## 2012-02-02 NOTE — Progress Notes (Signed)
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 #3 heterozygote factor V Leiden mutation. #4 autoimmune retinal vasculitis.  Current therapy: #1 Xarelto 20 mg by mouth daily. #2 aspirin 81 mg by mouth daily. #3 phlebotomy to maintain hemoglobin less than 14.  Interim history: Holly Shaffer presents today for an office followup visit.  Overall, she, reports, that she's doing relatively well.  She still complains of some fatigue, but is able to perform her activities of daily living without any hindrance or decline  She remains on prednisone and eye drops for her autoimmune retinal vasculitis.  She did followup with rheumatologist one time at [facility], however, there is no followup.  We will see if we can make a referral to local rheumatologist.  Her visual acuity has improved somewhat.  She remains on Xarelto without any problems.  She does not report any bleeding symptoms.  She has a good appetite.  She denies any nausea, vomiting, diarrhea, constipation, chest pain, shortness of breath, or cough.  She denies any fevers, chills, or night sweats.  She denies any obvious, abnormal bleeding.  She denies any headaches or rashes.  Her hemoglobin is 14.5, as such, she will need a phlebotomy today.     Review of Systems: Constitutional:Negative for malaise/fatigue, fever, chills, weight loss, diaphoresis, activity change, appetite change, and unexpected weight change.  HEENT: Negative for double vision, blurred vision, visual loss, ear pain, tinnitus, congestion, rhinorrhea, epistaxis sore throat or sinus disease, oral pain/lesion, tongue soreness Respiratory: Negative for cough, chest tightness, shortness of breath, wheezing and stridor.  Cardiovascular: Negative for chest pain, palpitations, leg swelling, orthopnea, PND, DOE or claudication Gastrointestinal: Negative for nausea, vomiting, abdominal pain, diarrhea, constipation, blood in stool, melena, hematochezia,  abdominal distention, anal bleeding, rectal pain, anorexia and hematemesis.  Genitourinary: Negative for dysuria, frequency, hematuria,  Musculoskeletal: Negative for myalgias, back pain, joint swelling, arthralgias and gait problem.  Skin: Negative for rash, color change, pallor and wound.  Neurological:. Negative for dizziness/light-headedness, tremors, seizures, syncope, facial asymmetry, speech difficulty, weakness, numbness, headaches and paresthesias.  Hematological: Negative for adenopathy. Does not bruise/bleed easily.  Psychiatric/Behavioral:  Negative for depression, no loss of interest in normal activity or change in sleep pattern.   Physical Exam: This is a 42 year old, well-developed, well-nourished, white female, in no obvious distress Vitals: Temperature 98.4 degrees, pulse 86, respirations 16, blood pressure 122/74 weight 133 pounds HEENT reveals a normocephalic, atraumatic skull, no scleral icterus, no oral lesions  Neck is supple without any cervical or supraclavicular adenopathy.  Lungs are clear to auscultation bilaterally. There are no wheezes, rales or rhonci Cardiac is regular rate and rhythm with a normal S1 and S2. There are no murmurs, rubs, or bruits.  Abdomen is soft with good bowel sounds, there is no palpable mass. There is no palpable hepatosplenomegaly. There is no palpable fluid wave.  Musculoskeletal no tenderness of the spine, ribs, or hips.  Extremities there are no clubbing, cyanosis, or edema.  Skin no petechia, purpura or ecchymosis Neurologic is nonfocal.  Laboratory Data: White count 8.2, hemoglobin 14.5, hematocrit 42.6, platelets 236,000  Current Outpatient Prescriptions on File Prior to Visit  Medication Sig Dispense Refill  . prednisoLONE acetate (PRED FORTE) 1 % ophthalmic suspension       . XARELTO 20 MG TABS TAKE 20 MG BY MOUTH DAILY AT 12 NOON.  30 tablet  6  . zolpidem (AMBIEN) 10 MG tablet TAKE 1 TABLET BY MOUTH AT BEDTIME AS NEEDED FOR  SLEEP  30  tablet  2    Assessment/Plan: This is a pleasant, 42 year old, white female, with the following issues:  #1.  Polycythemia vera.  Her hemoglobin is above 14 today, as, such, we will  phlebotomize her.  We like to maintain her hemoglobin below 14.  #2.  Recurrent deep venous thrombosis/pulmonary embolism.  She remains on Xarelto 20 mg daily  #3.  Autoimmune retinal vasculitis.  She remains on her prednisone and eye drops.  She will continue to followup with the retinal specialist, down at [facility].  I am going to see if we can't make a local referral to rheumatologist here for her.  #4.  Followup.  We will follow back up with Holly Shaffer one month, but before then should there be questions or concerns.  Marland Kitchen

## 2012-02-02 NOTE — Progress Notes (Signed)
Holly Shaffer presents today for phlebotomy per MD orders. Phlebotomy procedure started at 1020 and ended at 1025 0 grams removed.  Unable to cannulate, patient requests to return next week.  Patient tolerated procedure well. IV needle removed intact.

## 2012-02-05 ENCOUNTER — Telehealth: Payer: Self-pay | Admitting: Hematology & Oncology

## 2012-02-05 ENCOUNTER — Other Ambulatory Visit: Payer: Self-pay | Admitting: Medical

## 2012-02-05 NOTE — Telephone Encounter (Signed)
Faxed referral to [facility] at [facility] fax [phone] to schedule pt with Dr. Corliss Skains rheumatology.

## 2012-02-08 ENCOUNTER — Telehealth: Payer: Self-pay | Admitting: Hematology & Oncology

## 2012-02-08 NOTE — Telephone Encounter (Signed)
Talked to [facility] at Dr. Fatima Sanger office there computers are down but she thinks pt has been scheduled and will let me know.

## 2012-02-09 ENCOUNTER — Telehealth: Payer: Self-pay | Admitting: Hematology & Oncology

## 2012-02-09 NOTE — Telephone Encounter (Signed)
Holly Shaffer called stating they were waiting on records from [facility] that pt is suppose to get for them. Pt is aware and is talking to them to schedule appointment

## 2012-02-23 ENCOUNTER — Ambulatory Visit: Payer: BC Managed Care – PPO

## 2012-02-23 ENCOUNTER — Telehealth: Payer: Self-pay | Admitting: Hematology & Oncology

## 2012-02-23 NOTE — Progress Notes (Signed)
Pt here today for phlebotomy, venipuncture x 2 without results. To return Monday for phlebotomy. Teola Bradley, Jaslyn Bansal [employer]

## 2012-02-23 NOTE — Telephone Encounter (Signed)
Patient called stating she could not make her 12 noon apt.  Per Amy to sch anytime before 3:30.  Patient said she could be her around 3pm.  Apt was sch for 2:45

## 2012-02-26 ENCOUNTER — Ambulatory Visit: Payer: BC Managed Care – PPO

## 2012-02-26 NOTE — Progress Notes (Signed)
Patient did not show up for her appt.

## 2012-03-04 ENCOUNTER — Other Ambulatory Visit: Payer: BC Managed Care – PPO | Admitting: Lab

## 2012-03-04 ENCOUNTER — Ambulatory Visit: Payer: BC Managed Care – PPO | Admitting: Hematology & Oncology

## 2012-03-05 ENCOUNTER — Other Ambulatory Visit: Payer: BC Managed Care – PPO | Admitting: Lab

## 2012-03-05 ENCOUNTER — Telehealth: Payer: Self-pay | Admitting: Hematology & Oncology

## 2012-03-05 ENCOUNTER — Ambulatory Visit (HOSPITAL_BASED_OUTPATIENT_CLINIC_OR_DEPARTMENT_OTHER): Payer: BC Managed Care – PPO | Admitting: Hematology & Oncology

## 2012-03-05 VITALS — BP 133/79 | HR 78 | Temp 98.2°F | Resp 16 | Ht 64.0 in | Wt 133.0 lb

## 2012-03-05 DIAGNOSIS — H35069 Retinal vasculitis, unspecified eye: Secondary | ICD-10-CM

## 2012-03-05 DIAGNOSIS — D45 Polycythemia vera: Secondary | ICD-10-CM

## 2012-03-05 DIAGNOSIS — D6859 Other primary thrombophilia: Secondary | ICD-10-CM

## 2012-03-05 DIAGNOSIS — I82409 Acute embolism and thrombosis of unspecified deep veins of unspecified lower extremity: Secondary | ICD-10-CM

## 2012-03-05 DIAGNOSIS — Z86718 Personal history of other venous thrombosis and embolism: Secondary | ICD-10-CM

## 2012-03-05 DIAGNOSIS — Z832 Family history of diseases of the blood and blood-forming organs and certain disorders involving the immune mechanism: Secondary | ICD-10-CM

## 2012-03-05 LAB — IRON AND TIBC
%SAT: 62 % — ABNORMAL HIGH (ref 20–55)
Iron: 280 ug/dL — ABNORMAL HIGH (ref 42–145)
TIBC: 451 ug/dL (ref 250–470)

## 2012-03-05 LAB — CBC WITH DIFFERENTIAL (CANCER CENTER ONLY)
BASO%: 0.2 % (ref 0.0–2.0)
LYMPH#: 1.8 10*3/uL (ref 0.9–3.3)
MONO#: 0.6 10*3/uL (ref 0.1–0.9)
NEUT#: 7.8 10*3/uL — ABNORMAL HIGH (ref 1.5–6.5)
Platelets: 212 10*3/uL (ref 145–400)
RDW: 13.9 % (ref 11.1–15.7)
WBC: 10.4 10*3/uL — ABNORMAL HIGH (ref 3.9–10.0)

## 2012-03-05 LAB — D-DIMER, QUANTITATIVE: D-Dimer, Quant: 0.27 ug/mL-FEU (ref 0.00–0.48)

## 2012-03-05 LAB — CHCC SATELLITE - SMEAR

## 2012-03-05 LAB — FERRITIN: Ferritin: 24 ng/mL (ref 10–291)

## 2012-03-05 NOTE — Progress Notes (Signed)
DIAGNOSES: 1. Polycythemia vera. 2. Factor V Leiden mutation, heterozygote. 3. History of recurrent deep venous thrombosis/pulmonary embolism. 4. Retinal vasculitis, autoimmune.  CURRENT THERAPY: 1. Xarelto 20 mg p.o. daily. 2. Aspirin 81 mg p.o. daily. 3. Phlebotomy to maintain hemoglobin less than 14.  INTERIM HISTORY:  [name] comes in for her followup.  We last saw her back in January.  She has been seeing a rheumatologist for this autoimmune issue that she has.  She is not sure who she is going to be seeing.  She has been seen out at [facility].  She had been on steroids. She is off steroids right now.  They recommended Imuran but she does not want to take this.  She gets phlebotomized pretty much every 4-6 weeks.  I think she was last phlebotomized back in January.  She definitely is iron deficient.  Her last ferritin was 13.  Her last D-dimer was 0.29 back in January.  She is doing well with the Xarelto and aspirin.  She is still smoking but trying to cut back.  PHYSICAL EXAMINATION:  General:  This is a well-developed, well- nourished white female in no obvious distress.  Vital signs:  Show a temperature of 98.2, pulse 78, respiratory rate 16, blood pressure 133/79.  Weight is 133 pounds.  Head and neck:  Shows a normocephalic, atraumatic skull.  There are no ocular or oral lesions.  She may have a little bit of a cushingoid face from her steroids.  There is no adenopathy in the neck.  Lungs:  Clear bilaterally.  There are no rales, wheeze or rhonchi.  Cardiac:  Regular rate and rhythm with a normal S1 and S2.  There are no murmurs, rubs or bruits.  Abdomen:  Soft with good bowel sounds.  There is no palpable abdominal mass.  There is no fluid wave.  There is no palpable hepatosplenomegaly.  Back:  No tenderness over the spine, ribs or hips.  Extremities:  Show no clubbing, cyanosis or edema.  Neurological:  Shows no focal neurological deficits.   Skin: No rashes, ecchymoses or petechiae.  LABORATORY STUDIES:  White cell count 10.4, hemoglobin 14.4, hematocrit 41.9, platelet count 212.  IMPRESSION:  [name] is a very nice 42 year old white female with polycythemia.  She also has a factor V Leiden mutation.  She is on lifelong anticoagulation.  Her biggest issue, however, is this retinal vasculitis.  She is going to go see a local rheumatologist to try to help manage this.  We will go ahead and phlebotomize her this week.  She feels better with her hemoglobin below 14.  We will plan to get her back in another 4-6 weeks.  I do not see that we need to do any blood work in-between visits.    ______________________________ Josph Macho, M.D. PRE/MEDQ  D:  03/05/2012  T:  03/05/2012  Job:  [id number]

## 2012-03-05 NOTE — Telephone Encounter (Signed)
Patient called and change 03/05/12 apt time from 8:30 to 10:45 due to waking up late

## 2012-03-05 NOTE — Telephone Encounter (Signed)
Patient cx 03/05/12 phlebotomy apt and stated she will resch the apt later

## 2012-03-05 NOTE — Progress Notes (Signed)
This office note has been dictated.

## 2012-03-08 ENCOUNTER — Ambulatory Visit (HOSPITAL_BASED_OUTPATIENT_CLINIC_OR_DEPARTMENT_OTHER): Payer: BC Managed Care – PPO

## 2012-03-08 VITALS — BP 120/84 | HR 115 | Temp 97.4°F | Resp 20

## 2012-03-08 DIAGNOSIS — H35069 Retinal vasculitis, unspecified eye: Secondary | ICD-10-CM

## 2012-03-08 DIAGNOSIS — Z832 Family history of diseases of the blood and blood-forming organs and certain disorders involving the immune mechanism: Secondary | ICD-10-CM

## 2012-03-08 DIAGNOSIS — D45 Polycythemia vera: Secondary | ICD-10-CM

## 2012-03-08 NOTE — Patient Instructions (Signed)

## 2012-03-08 NOTE — Progress Notes (Signed)
Holly Shaffer presents today for phlebotomy per MD orders. Phlebotomy procedure started at 1410 and ended at 1420. Approximately 600 mls removed. Cannulated by ABickling RN. Patient observed for 30 minutes after procedure without any incident. Patient tolerated procedure well. IV needle removed intact.

## 2012-03-15 ENCOUNTER — Telehealth: Payer: Self-pay | Admitting: Hematology & Oncology

## 2012-03-15 NOTE — Telephone Encounter (Signed)
Patient called and cx 03/15/12 resch for 03/22/12

## 2012-03-25 ENCOUNTER — Telehealth: Payer: Self-pay | Admitting: Hematology & Oncology

## 2012-03-25 NOTE — Telephone Encounter (Signed)
Per Alvino Chapel to resch 03/22/12 apt due to bad weather.  Apt was resch for 03/28/12.  i called and gave patient apt date/time.  Patient is aware of apt and states she will be here

## 2012-03-28 ENCOUNTER — Ambulatory Visit (HOSPITAL_BASED_OUTPATIENT_CLINIC_OR_DEPARTMENT_OTHER): Payer: BC Managed Care – PPO

## 2012-03-28 VITALS — BP 131/73 | HR 88 | Temp 99.0°F | Resp 20

## 2012-03-28 DIAGNOSIS — Z832 Family history of diseases of the blood and blood-forming organs and certain disorders involving the immune mechanism: Secondary | ICD-10-CM

## 2012-03-28 DIAGNOSIS — H35069 Retinal vasculitis, unspecified eye: Secondary | ICD-10-CM

## 2012-03-28 DIAGNOSIS — I82409 Acute embolism and thrombosis of unspecified deep veins of unspecified lower extremity: Secondary | ICD-10-CM

## 2012-03-28 DIAGNOSIS — D45 Polycythemia vera: Secondary | ICD-10-CM

## 2012-03-28 NOTE — Progress Notes (Signed)
Holly Shaffer presents today for phlebotomy per MD orders. Phlebotomy procedure started at 1340 and ended at 1350. Approximately removed. Patient observed for 30 minutes after procedure without any incident. Patient tolerated procedure well. IV needle removed intact.

## 2012-03-28 NOTE — Patient Instructions (Signed)

## 2012-04-19 ENCOUNTER — Other Ambulatory Visit (HOSPITAL_BASED_OUTPATIENT_CLINIC_OR_DEPARTMENT_OTHER): Payer: BC Managed Care – PPO | Admitting: Lab

## 2012-04-19 ENCOUNTER — Ambulatory Visit: Payer: BC Managed Care – PPO

## 2012-04-19 ENCOUNTER — Ambulatory Visit (HOSPITAL_BASED_OUTPATIENT_CLINIC_OR_DEPARTMENT_OTHER): Payer: BC Managed Care – PPO | Admitting: Medical

## 2012-04-19 ENCOUNTER — Other Ambulatory Visit: Payer: Self-pay | Admitting: Medical

## 2012-04-19 VITALS — BP 118/72 | HR 82 | Temp 99.1°F | Resp 16 | Ht 64.0 in | Wt 132.0 lb

## 2012-04-19 DIAGNOSIS — D45 Polycythemia vera: Secondary | ICD-10-CM

## 2012-04-19 DIAGNOSIS — I82402 Acute embolism and thrombosis of unspecified deep veins of left lower extremity: Secondary | ICD-10-CM

## 2012-04-19 DIAGNOSIS — H35069 Retinal vasculitis, unspecified eye: Secondary | ICD-10-CM

## 2012-04-19 DIAGNOSIS — D6859 Other primary thrombophilia: Secondary | ICD-10-CM

## 2012-04-19 DIAGNOSIS — Z86718 Personal history of other venous thrombosis and embolism: Secondary | ICD-10-CM

## 2012-04-19 LAB — CBC WITH DIFFERENTIAL (CANCER CENTER ONLY)
BASO#: 0 10*3/uL (ref 0.0–0.2)
BASO%: 0.2 % (ref 0.0–2.0)
EOS%: 1.2 % (ref 0.0–7.0)
HGB: 12.4 g/dL (ref 11.6–15.9)
LYMPH#: 2.3 10*3/uL (ref 0.9–3.3)
MCH: 32 pg (ref 26.0–34.0)
MCHC: 33.7 g/dL (ref 32.0–36.0)
MONO%: 10.4 % (ref 0.0–13.0)
NEUT#: 5.1 10*3/uL (ref 1.5–6.5)
Platelets: 220 10*3/uL (ref 145–400)
RBC: 3.87 10*6/uL (ref 3.70–5.32)

## 2012-04-19 LAB — CHCC SATELLITE - SMEAR

## 2012-04-19 LAB — D-DIMER, QUANTITATIVE: D-Dimer, Quant: 0.27 ug/mL-FEU (ref 0.00–0.48)

## 2012-04-19 LAB — FERRITIN: Ferritin: 7 ng/mL — ABNORMAL LOW (ref 10–291)

## 2012-04-19 NOTE — Progress Notes (Signed)
No need for phlebotomy, Hgb 12.4.  Seen by Eunice Blase, PA.

## 2012-04-19 NOTE — Progress Notes (Signed)
DIAGNOSES: 1. Polycythemia vera. 2. Factor V Leiden mutation, heterozygote. 3. History of recurrent deep venous thrombosis/pulmonary embolism. 4. Retinal vasculitis, autoimmune.  CURRENT THERAPY: 1. Xarelto 20 mg p.o. daily. 2. Aspirin 81 mg p.o. daily. 3. Phlebotomy to maintain hemoglobin less than 14.  INTERIM HISTORY: [name] presents today for an office followup visit.  Overall, she, reports, that she's doing quite well.  She states, that she has not yet seen a rheumatologist in [place] for her retinal vasculitis.  She actually states, that she missed the appointment.  She was seeing a rheumatologist down at [facility], however, prefer to see one in [place].  We can go ahead and get another referral for her.  We like to keep her hemoglobin below 14.  She seems to feel her best.  When is below 14.  Her hemoglobin is 12.4.  Today, as, such, she will not need to be phlebotomized.  Her last ferritin a month ago, was 24.  She still remains on Xarelto without any problems.  Her last d-dimer in February.  Was 0.2, 7.  She also remains on aspirin.  She still smoking, but is trying to cut back.  She otherwise denies any nausea, vomiting, diarrhea, constipation, chest pain, shortness of breath, or cough.  She denies any fevers, chills, or night sweats.  She denies any abdominal pain, any lower leg swelling.  She denies any obvious, or abnormal bleeding.  She denies any headaches, visual changes, or rashes.  She denies any unintentional weight loss, or weight gain.  Of note, she does have some intermittent itching of her skin.  Most likely secondary to the, P. VERA. She is utilizing moisturizers.  She is on Pepcid.  I did advise her to try over-the-counter Claritin.  Review of Systems: Constitutional:Negative for malaise/fatigue, fever, chills, weight loss, diaphoresis, activity change, appetite change, and unexpected weight change.  HEENT: Negative for double vision, blurred vision, visual loss, ear  pain, tinnitus, congestion, rhinorrhea, epistaxis sore throat or sinus disease, oral pain/lesion, tongue soreness Respiratory: Negative for cough, chest tightness, shortness of breath, wheezing and stridor.  Cardiovascular: Negative for chest pain, palpitations, leg swelling, orthopnea, PND, DOE or claudication Gastrointestinal: Negative for nausea, vomiting, abdominal pain, diarrhea, constipation, blood in stool, melena, hematochezia, abdominal distention, anal bleeding, rectal pain, anorexia and hematemesis.  Genitourinary: Negative for dysuria, frequency, hematuria,  Musculoskeletal: Negative for myalgias, back pain, joint swelling, arthralgias and gait problem.  Skin: Negative for rash, color change, pallor and wound.  Neurological:. Negative for dizziness/light-headedness, tremors, seizures, syncope, facial asymmetry, speech difficulty, weakness, numbness, headaches and paresthesias.  Hematological: Negative for adenopathy. Does not bruise/bleed easily.  Psychiatric/Behavioral:  Negative for depression, no loss of interest in normal activity or change in sleep pattern.   Physical Exam: This is a pleasant, 42 year old, white female, in no obvious distress Vitals: Temperature 99.1 degrees, pulse 82, respirations 16, blood pressure 118/72.  Weight 132 pounds HEENT reveals a normocephalic, atraumatic skull, no scleral icterus, no oral lesions  Neck is supple without any cervical or supraclavicular adenopathy.  Lungs are clear to auscultation bilaterally. There are no wheezes, rales or rhonci Cardiac is regular rate and rhythm with a normal S1 and S2. There are no murmurs, rubs, or bruits.  Abdomen is soft with good bowel sounds, there is no palpable mass. There is no palpable hepatosplenomegaly. There is no palpable fluid wave.  Musculoskeletal no tenderness of the spine, ribs, or hips.  Extremities there are no clubbing, cyanosis, or edema.  Skin no  petechia, purpura or ecchymosis Neurologic  is nonfocal.  Laboratory Data: White count 8.4, hemoglobin 12.4, hematocrit 36.8, platelets 220,000  Current Outpatient Prescriptions on File Prior to Visit  Medication Sig Dispense Refill  . prednisoLONE acetate (PRED FORTE) 1 % ophthalmic suspension Apply 1 drop to eye as needed.       Carlena Hurl 20 MG TABS TAKE 20 MG BY MOUTH DAILY AT 12 NOON.  30 tablet  6  . zolpidem (AMBIEN) 10 MG tablet TAKE 1 TABLET BY MOUTH AT BEDTIME AS NEEDED FOR SLEEP  30 tablet  2   No current facility-administered medications on file prior to visit.   Assessment/Plan: This is a pleasant, 42 year old, white female, with the following issues:  #1.  Polycythemia.  Her hematocrit is below 14.  Today.  She does not need a phlebotomy.  She feels her best.  On her hematocrit is below 14.  #2.  History of left lower [place] he DVT.  She does has factor V Leyden mutation.  She is on lifelong anticoagulation with Xarelto.   #3.  Retinal vasculitis.  We will go ahead and make another referral to rheumatologist.  #4.  Followup.  We will follow back up with Ms. Maclaren, and 46 weeks, but before then should there be questions or concerns.

## 2012-05-01 ENCOUNTER — Other Ambulatory Visit: Payer: Self-pay | Admitting: Hematology & Oncology

## 2012-05-31 ENCOUNTER — Ambulatory Visit: Payer: BC Managed Care – PPO | Admitting: Hematology & Oncology

## 2012-05-31 ENCOUNTER — Ambulatory Visit (HOSPITAL_BASED_OUTPATIENT_CLINIC_OR_DEPARTMENT_OTHER): Payer: BC Managed Care – PPO | Admitting: Medical

## 2012-05-31 ENCOUNTER — Other Ambulatory Visit: Payer: BC Managed Care – PPO | Admitting: Lab

## 2012-05-31 ENCOUNTER — Other Ambulatory Visit (HOSPITAL_BASED_OUTPATIENT_CLINIC_OR_DEPARTMENT_OTHER): Payer: BC Managed Care – PPO | Admitting: Lab

## 2012-05-31 ENCOUNTER — Ambulatory Visit: Payer: BC Managed Care – PPO

## 2012-05-31 VITALS — BP 113/69 | HR 81 | Temp 98.8°F | Resp 16 | Ht 64.0 in | Wt 130.0 lb

## 2012-05-31 DIAGNOSIS — D45 Polycythemia vera: Secondary | ICD-10-CM

## 2012-05-31 DIAGNOSIS — Z86718 Personal history of other venous thrombosis and embolism: Secondary | ICD-10-CM

## 2012-05-31 DIAGNOSIS — I82402 Acute embolism and thrombosis of unspecified deep veins of left lower extremity: Secondary | ICD-10-CM

## 2012-05-31 DIAGNOSIS — Z7901 Long term (current) use of anticoagulants: Secondary | ICD-10-CM

## 2012-05-31 DIAGNOSIS — D6859 Other primary thrombophilia: Secondary | ICD-10-CM

## 2012-05-31 DIAGNOSIS — Z832 Family history of diseases of the blood and blood-forming organs and certain disorders involving the immune mechanism: Secondary | ICD-10-CM

## 2012-05-31 LAB — CBC WITH DIFFERENTIAL (CANCER CENTER ONLY)
BASO%: 0.1 % (ref 0.0–2.0)
Eosinophils Absolute: 0.1 10*3/uL (ref 0.0–0.5)
LYMPH#: 2.8 10*3/uL (ref 0.9–3.3)
LYMPH%: 29.2 % (ref 14.0–48.0)
MCV: 94 fL (ref 81–101)
MONO#: 0.6 10*3/uL (ref 0.1–0.9)
NEUT#: 6 10*3/uL (ref 1.5–6.5)
Platelets: 268 10*3/uL (ref 145–400)
RBC: 4.43 10*6/uL (ref 3.70–5.32)
RDW: 14.1 % (ref 11.1–15.7)
WBC: 9.6 10*3/uL (ref 3.9–10.0)

## 2012-05-31 NOTE — Progress Notes (Signed)
DIAGNOSES: 1. Polycythemia vera. 2. Factor V Leiden mutation, heterozygote. 3. History of recurrent deep venous thrombosis/pulmonary embolism. 4. Retinal vasculitis, autoimmune.  CURRENT THERAPY: 1. Xarelto 20 mg p.o. daily. 2. Aspirin 81 mg p.o. daily. 3. Phlebotomy to maintain hemoglobin less than 14.  INTERIM HISTORY: Holly Shaffer presents today for an office followup visit.  Overall, she, reports, that she's doing quite well.  She's not reporting any new complaints.  Her hemoglobin is right at 14 today.  We like to keep it below 14.  She will make an appointment for a phlebotomy next week.  Her last ferritin was 17. She still remains on Xarelto without any problems.  Her last d-dimer in April was 0.27. She also remains on aspirin.  She still smoking, but is trying to cut back.  She is on Chantix. She otherwise denies any nausea, vomiting, diarrhea, constipation, chest pain, shortness of breath, or cough.  She denies any fevers, chills, or night sweats.  She denies any abdominal pain, any lower leg swelling.  She denies any obvious, or abnormal bleeding.  She denies any headaches, visual changes, or rashes.  She denies any unintentional weight loss, or weight gain.  Of note, she does have some intermittent itching of her skin.  Most likely secondary to the, P. VERA. She is utilizing moisturizers.  She is on Pepcid.  I did advise her to try over-the-counter Claritin.  Review of Systems: Constitutional:Negative for malaise/fatigue, fever, chills, weight loss, diaphoresis, activity change, appetite change, and unexpected weight change.  HEENT: Negative for double vision, blurred vision, visual loss, ear pain, tinnitus, congestion, rhinorrhea, epistaxis sore throat or sinus disease, oral pain/lesion, tongue soreness Respiratory: Negative for cough, chest tightness, shortness of breath, wheezing and stridor.  Cardiovascular: Negative for chest pain, palpitations, leg swelling, orthopnea, PND, DOE or  claudication Gastrointestinal: Negative for nausea, vomiting, abdominal pain, diarrhea, constipation, blood in stool, melena, hematochezia, abdominal distention, anal bleeding, rectal pain, anorexia and hematemesis.  Genitourinary: Negative for dysuria, frequency, hematuria,  Musculoskeletal: Negative for myalgias, back pain, joint swelling, arthralgias and gait problem.  Skin: Negative for rash, color change, pallor and wound.  Neurological:. Negative for dizziness/light-headedness, tremors, seizures, syncope, facial asymmetry, speech difficulty, weakness, numbness, headaches and paresthesias.  Hematological: Negative for adenopathy. Does not bruise/bleed easily.  Psychiatric/Behavioral:  Negative for depression, no loss of interest in normal activity or change in sleep pattern.   Physical Exam: This is a pleasant, 42 year old, white female, in no obvious distress Vitals: Temperature 98.8 degrees 81 respirations 16 pressure 113/69 weight 130 pounds HEENT reveals a normocephalic, atraumatic skull, no scleral icterus, no oral lesions  Neck is supple without any cervical or supraclavicular adenopathy.  Lungs are clear to auscultation bilaterally. There are no wheezes, rales or rhonci Cardiac is regular rate and rhythm with a normal S1 and S2. There are no murmurs, rubs, or bruits.  Abdomen is soft with good bowel sounds, there is no palpable mass. There is no palpable hepatosplenomegaly. There is no palpable fluid wave.  Musculoskeletal no tenderness of the spine, ribs, or hips.  Extremities there are no clubbing, cyanosis, or edema.  Skin no petechia, purpura or ecchymosis Neurologic is nonfocal.  Laboratory Data: White count 9.6 hemoglobin 14.0 hematocrit 41.8 platelets 268,000  Current Outpatient Prescriptions on File Prior to Visit  Medication Sig Dispense Refill  . prednisoLONE acetate (PRED FORTE) 1 % ophthalmic suspension Apply 1 drop to eye as needed.       Carlena Hurl 20 MG TABS TAKE  20 MG BY MOUTH DAILY AT 12 NOON.  30 tablet  6  . zolpidem (AMBIEN) 10 MG tablet TAKE 1 TABLET BY MOUTH AT BEDTIME AS NEEDED FOR SLEEP  30 tablet  2   No current facility-administered medications on file prior to visit.   Assessment/Plan: This is a pleasant, 42 year old, white female, with the following issues:  #1.  Polycythemia.  Her hemoglobin is 14 today.  We will get her set up for a phlebotomy next week.  #2.  History of left lower extremity DVT.  She does has factor V Leiden mutation.  She is on lifelong anticoagulation with Xarelto.   #3.  Retinal vasculitis.  She sees a rheumatologist down at [facility].  #4.  Followup.  We will follow back up with Ms. Clausen, and 4-6 weeks, but before then should there be questions or concerns.

## 2012-05-31 NOTE — Progress Notes (Signed)
Patient seen by Eunice Blase, PA. Pt requests not to have phlebotomy today, will reschedule for next week.

## 2012-06-01 LAB — FERRITIN: Ferritin: 7 ng/mL — ABNORMAL LOW (ref 10–291)

## 2012-06-04 ENCOUNTER — Telehealth: Payer: Self-pay | Admitting: Hematology & Oncology

## 2012-06-04 NOTE — Telephone Encounter (Signed)
Patient called and sch phlebotomy apt for 06/06/12

## 2012-06-06 ENCOUNTER — Telehealth: Payer: Self-pay | Admitting: Hematology & Oncology

## 2012-06-06 NOTE — Telephone Encounter (Signed)
Left pt message to call and rescheduled missed 5-22 appointment

## 2012-06-17 ENCOUNTER — Telehealth: Payer: Self-pay | Admitting: Hematology & Oncology

## 2012-06-17 NOTE — Telephone Encounter (Signed)
Pt called made 6-3 appointment for phlebotomy

## 2012-06-18 ENCOUNTER — Ambulatory Visit (HOSPITAL_BASED_OUTPATIENT_CLINIC_OR_DEPARTMENT_OTHER): Payer: BC Managed Care – PPO

## 2012-06-18 ENCOUNTER — Telehealth: Payer: Self-pay | Admitting: Hematology & Oncology

## 2012-06-18 DIAGNOSIS — Z832 Family history of diseases of the blood and blood-forming organs and certain disorders involving the immune mechanism: Secondary | ICD-10-CM

## 2012-06-18 DIAGNOSIS — D45 Polycythemia vera: Secondary | ICD-10-CM

## 2012-06-18 DIAGNOSIS — H35069 Retinal vasculitis, unspecified eye: Secondary | ICD-10-CM

## 2012-06-18 DIAGNOSIS — I82409 Acute embolism and thrombosis of unspecified deep veins of unspecified lower extremity: Secondary | ICD-10-CM

## 2012-06-18 NOTE — Patient Instructions (Signed)

## 2012-06-18 NOTE — Progress Notes (Signed)
Holly Shaffer presents today for phlebotomy per MD orders. Phlebotomy procedure started at 1215 and ended at 1230. 500 grams removed. Patient observed for 30 minutes after procedure without any incident. Patient tolerated procedure well. IV needle removed intact.

## 2012-06-19 NOTE — Telephone Encounter (Signed)
i am closing the note

## 2012-06-21 ENCOUNTER — Other Ambulatory Visit: Payer: Self-pay | Admitting: Hematology & Oncology

## 2012-06-21 DIAGNOSIS — D45 Polycythemia vera: Secondary | ICD-10-CM

## 2012-07-12 ENCOUNTER — Ambulatory Visit: Payer: BC Managed Care – PPO

## 2012-07-12 ENCOUNTER — Ambulatory Visit (HOSPITAL_BASED_OUTPATIENT_CLINIC_OR_DEPARTMENT_OTHER): Payer: BC Managed Care – PPO | Admitting: Hematology & Oncology

## 2012-07-12 ENCOUNTER — Other Ambulatory Visit (HOSPITAL_BASED_OUTPATIENT_CLINIC_OR_DEPARTMENT_OTHER): Payer: BC Managed Care – PPO

## 2012-07-12 ENCOUNTER — Other Ambulatory Visit: Payer: BC Managed Care – PPO | Admitting: Lab

## 2012-07-12 VITALS — BP 127/78 | HR 85 | Temp 98.6°F | Resp 16 | Wt 130.0 lb

## 2012-07-12 DIAGNOSIS — D45 Polycythemia vera: Secondary | ICD-10-CM

## 2012-07-12 DIAGNOSIS — Z832 Family history of diseases of the blood and blood-forming organs and certain disorders involving the immune mechanism: Secondary | ICD-10-CM

## 2012-07-12 DIAGNOSIS — I82403 Acute embolism and thrombosis of unspecified deep veins of lower extremity, bilateral: Secondary | ICD-10-CM

## 2012-07-12 DIAGNOSIS — I82409 Acute embolism and thrombosis of unspecified deep veins of unspecified lower extremity: Secondary | ICD-10-CM

## 2012-07-12 LAB — CBC WITH DIFFERENTIAL (CANCER CENTER ONLY)
BASO%: 0.3 % (ref 0.0–2.0)
Eosinophils Absolute: 0.1 10*3/uL (ref 0.0–0.5)
MONO#: 0.6 10*3/uL (ref 0.1–0.9)
NEUT#: 4.1 10*3/uL (ref 1.5–6.5)
Platelets: 225 10*3/uL (ref 145–400)
RBC: 4.32 10*6/uL (ref 3.70–5.32)
RDW: 15.1 % (ref 11.1–15.7)
WBC: 7.3 10*3/uL (ref 3.9–10.0)

## 2012-07-12 LAB — D-DIMER, QUANTITATIVE: D-Dimer, Quant: 0.27 ug/mL-FEU (ref 0.00–0.48)

## 2012-07-12 LAB — CHCC SATELLITE - SMEAR

## 2012-07-12 NOTE — Progress Notes (Signed)
This office note has been dictated.

## 2012-07-14 NOTE — Progress Notes (Signed)
DIAGNOSES: 1. 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 2. Factor V Leiden mutation with a history of recurrent DVT/PE     (heterozygous). 3. Autoimmune retinal vasculitis.  CURRENT THERAPY: 1. Phlebotomy to maintain a hemoglobin less than 14%. 2. Aspirin 81 mg p.o. daily. 3. Xarelto 20 mg p.o. daily.  INTERVAL HISTORY:  Holly Shaffer comes in for followup.  She is still having no issues with the eyes.  She has not been seen at [facility] for awhile.  She has been seen for this problem.  She was recommended to go on Imuran but she did not want to take this.  I am not sure if she is seeing the rheumatologist.  I am sure that she probably is.  She last phlebotomized back in May.  At that point in time, her hemoglobin was 14.  She is iron deficient.  Her ferritin back in May was 7%.  We did check her D-dimer.  It was 0.27 back in May.  She underwent an immense amount of stress.  Her daughter is having a lot of health issues that is really effecting Holly Shaffer.  She lives in [place] now.  She still works in [place].  PHYSICAL EXAMINATION:  General:  This is a well-developed, well- nourished white female in no obvious distress.  Vital signs:  Shows a temperature of 98.6, pulse 85, respiratory rate 18, blood pressure 127/78.  Weight is 130.  Head and neck:  Normocephalic, atraumatic skull.  There are no ocular or oral lesions.  There are no palpable cervical or supraclavicular lymph nodes.  Lungs:  Clear bilaterally. Cardiac:  Regular rate and rhythm with a normal S1, S2.  There are no murmurs, rubs or bruits.  Abdomen:  Soft with good bowel sounds.  There is no palpable hepatosplenomegaly.  Back:  No tenderness over the spine, ribs, or hips.  Extremities:  Show no clubbing, cyanosis or edema.  LABORATORY STUDIES:  White cell count is 7.3, hemoglobin 13.6, hematocrit 40.2, platelet count 225.  MCV is 93.  IMPRESSION:  Holly Shaffer is very charming, 42 year old, white female with multiple  hematologic issues.  She has polycythemia vera.  She has had history of DVT.  She is heterozygous for factor V Leiden mutation. She does not need to be phlebotomized today.  Again, we are keeping her hemoglobin below 14.  We will continue on the Xarelto.  Again, this is lifelong.  She has had no bleeding problems from this.  Of note, she has a Mirena IUD so she does not have any cycles.  We will plan to get her back in another 6 weeks.  She may need to be phlebotomized at that point in time.    ______________________________ Josph Macho, M.D. PRE/MEDQ  D:  07/12/2012  T:  07/14/2012  Job:  [id number]

## 2012-07-23 ENCOUNTER — Ambulatory Visit (HOSPITAL_BASED_OUTPATIENT_CLINIC_OR_DEPARTMENT_OTHER): Payer: BC Managed Care – PPO

## 2012-07-23 VITALS — BP 105/69 | HR 83 | Temp 99.8°F

## 2012-07-23 DIAGNOSIS — I82402 Acute embolism and thrombosis of unspecified deep veins of left lower extremity: Secondary | ICD-10-CM

## 2012-07-23 DIAGNOSIS — Z832 Family history of diseases of the blood and blood-forming organs and certain disorders involving the immune mechanism: Secondary | ICD-10-CM

## 2012-07-23 DIAGNOSIS — H35069 Retinal vasculitis, unspecified eye: Secondary | ICD-10-CM

## 2012-07-23 DIAGNOSIS — D45 Polycythemia vera: Secondary | ICD-10-CM

## 2012-07-23 NOTE — Progress Notes (Signed)
Holly Shaffer presents today for phlebotomy per MD orders. Phlebotomy procedure started at 1415 and ended at 1430. 425 mls removed. Patient observed for 30 minutes after procedure without any incident. Patient tolerated procedure well. IV needle removed intact.

## 2012-07-23 NOTE — Patient Instructions (Signed)

## 2012-08-09 ENCOUNTER — Other Ambulatory Visit: Payer: Self-pay | Admitting: Hematology & Oncology

## 2012-08-23 ENCOUNTER — Ambulatory Visit: Payer: BC Managed Care – PPO

## 2012-08-23 ENCOUNTER — Other Ambulatory Visit (HOSPITAL_BASED_OUTPATIENT_CLINIC_OR_DEPARTMENT_OTHER): Payer: BC Managed Care – PPO | Admitting: Lab

## 2012-08-23 ENCOUNTER — Ambulatory Visit (HOSPITAL_BASED_OUTPATIENT_CLINIC_OR_DEPARTMENT_OTHER): Payer: BC Managed Care – PPO | Admitting: Hematology & Oncology

## 2012-08-23 VITALS — BP 122/75 | HR 77 | Temp 98.5°F | Resp 16 | Ht 64.0 in | Wt 130.0 lb

## 2012-08-23 DIAGNOSIS — Z832 Family history of diseases of the blood and blood-forming organs and certain disorders involving the immune mechanism: Secondary | ICD-10-CM

## 2012-08-23 DIAGNOSIS — Z86718 Personal history of other venous thrombosis and embolism: Secondary | ICD-10-CM

## 2012-08-23 DIAGNOSIS — D6859 Other primary thrombophilia: Secondary | ICD-10-CM

## 2012-08-23 DIAGNOSIS — Z7901 Long term (current) use of anticoagulants: Secondary | ICD-10-CM

## 2012-08-23 DIAGNOSIS — H35069 Retinal vasculitis, unspecified eye: Secondary | ICD-10-CM

## 2012-08-23 DIAGNOSIS — D45 Polycythemia vera: Secondary | ICD-10-CM

## 2012-08-23 DIAGNOSIS — E568 Deficiency of other vitamins: Secondary | ICD-10-CM

## 2012-08-23 DIAGNOSIS — I82403 Acute embolism and thrombosis of unspecified deep veins of lower extremity, bilateral: Secondary | ICD-10-CM

## 2012-08-23 DIAGNOSIS — Z86711 Personal history of pulmonary embolism: Secondary | ICD-10-CM

## 2012-08-23 LAB — CBC WITH DIFFERENTIAL (CANCER CENTER ONLY)
BASO%: 0.2 % (ref 0.0–2.0)
Eosinophils Absolute: 0.2 10*3/uL (ref 0.0–0.5)
LYMPH%: 28 % (ref 14.0–48.0)
MCH: 31.9 pg (ref 26.0–34.0)
MCV: 94 fL (ref 81–101)
MONO#: 0.7 10*3/uL (ref 0.1–0.9)
MONO%: 7.8 % (ref 0.0–13.0)
NEUT#: 5.6 10*3/uL (ref 1.5–6.5)
Platelets: 229 10*3/uL (ref 145–400)
RBC: 4.29 10*6/uL (ref 3.70–5.32)
RDW: 14 % (ref 11.1–15.7)
WBC: 9 10*3/uL (ref 3.9–10.0)

## 2012-08-23 LAB — FERRITIN CHCC: Ferritin: 10 ng/ml (ref 9–269)

## 2012-08-23 NOTE — Progress Notes (Signed)
This office note has been dictated.

## 2012-08-23 NOTE — Progress Notes (Signed)
No phlebotomy today per dr. ennever 

## 2012-08-24 NOTE — Progress Notes (Signed)
DIAGNOSES: 1. Polycythemia vera, JAK2 negative. 2. 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 3. Autoimmune retinal vasculitis.  CURRENT THERAPIES: 1. Phlebotomy to maintain hemoglobin below 12. 2. Xarelto 20 mg p.o. q. day. 3. Aspirin 81 mg p.o. q. day.  INTERIM HISTORY:  Holly Shaffer comes in for followup.  She is doing fairly well.  She has had no complaints at all.  She still has the retinal issues.  She has the visual problems.  She is not going back to [place].  She has had no problems with the Xarelto.  She has some issues with vascular-type lesions on her left foot.  These are a little bit painful. She did have the DVT in her left leg.  I suspect that she probably has some vascular insufficiency on that leg.  She has had no fevers, sweats, or chills.  She has had no cough.  She is still smoking.  She has had no nausea or vomiting.  PHYSICAL EXAM:  General:  This is a well-developed, well-nourished white female in no obvious distress.  Vital Signs:  Show a temperature of 98.5, pulse 77, respiratory rate 16, blood pressure 122/75.  Weight is 130 pounds.  Head and Neck:  Show a normocephalic, atraumatic skull. There are no ocular or oral lesions.  There are no palpable cervical or supraclavicular lymph nodes.  Lungs:  Clear bilaterally.  Cardiac: Regular rate and rhythm with a normal S1 and S2.  There are no murmurs, rubs, or bruits.  Abdomen:  Soft.  She has good bowel sounds.  There is no fluid wave.  No palpable hepatosplenomegaly.  Back:  No tenderness over the spine, ribs, or hips.  Extremities:  Show no clubbing, cyanosis, or edema.  There may be some slight nonpitting edema of the left leg.  She does have these vascular-type lesions on her left foot. Mostly they are on the medial aspect of the left foot.  They do blanch. Neurological:  Shows no focal neurological deficits.  LABORATORY STUDIES:  White cell count  is 9.  Hemoglobin 13.7, hematocrit 40.2, platelet count 229.  IMPRESSION:  [name] is a 42 year old white female with polycythemia vera.  I do not think we need to phlebotomize her right now.  As such, we can just follow her along.  She is clearly iron deficient.  Her last ferritin was 5.  I will plan to get her back in another, I think, 6 weeks.  I think that with her being iron deficient that it is going to be tough for her to mount a erythrocytotic reaction.    ______________________________ Holly Shaffer, M.D. PRE/MEDQ  D:  08/23/2012  T:  08/24/2012  Job:  [id number]

## 2012-08-30 ENCOUNTER — Ambulatory Visit (HOSPITAL_BASED_OUTPATIENT_CLINIC_OR_DEPARTMENT_OTHER): Payer: BC Managed Care – PPO

## 2012-08-30 VITALS — BP 105/74 | HR 94 | Temp 99.8°F | Resp 18

## 2012-08-30 DIAGNOSIS — D45 Polycythemia vera: Secondary | ICD-10-CM

## 2012-08-30 NOTE — Progress Notes (Signed)
Holly Shaffer presents today for phlebotomy per MD orders. Phlebotomy procedure started at 1225 and ended at 1235. 500 grams removed. Patient observed for 30 minutes after procedure without any incident. Patient tolerated procedure well. IV needle removed intact.

## 2012-08-30 NOTE — Patient Instructions (Addendum)

## 2012-09-06 ENCOUNTER — Telehealth: Payer: Self-pay | Admitting: Hematology & Oncology

## 2012-09-06 NOTE — Telephone Encounter (Signed)
Patient called and cx 09/06/12 apt and resch for 09/13/12

## 2012-09-13 ENCOUNTER — Ambulatory Visit (HOSPITAL_BASED_OUTPATIENT_CLINIC_OR_DEPARTMENT_OTHER): Payer: BC Managed Care – PPO

## 2012-09-13 VITALS — BP 130/81 | HR 88 | Temp 100.0°F | Resp 18

## 2012-09-13 DIAGNOSIS — D45 Polycythemia vera: Secondary | ICD-10-CM

## 2012-09-13 DIAGNOSIS — Z832 Family history of diseases of the blood and blood-forming organs and certain disorders involving the immune mechanism: Secondary | ICD-10-CM

## 2012-09-13 MED ORDER — PROMETHAZINE HCL 25 MG PO TABS
25.0000 mg | ORAL_TABLET | Freq: Once | ORAL | Status: AC
  Administered 2012-09-13: 25 mg via ORAL

## 2012-09-13 NOTE — Patient Instructions (Signed)

## 2012-09-13 NOTE — Progress Notes (Signed)
Selyna Klahn presents today for phlebotomy per MD orders. Phlebotomy procedure started at 1345  and ended at  1354.  removed. Patient observed for 30 minutes after procedure without any incident. Patient tolerated procedure well. IV needle removed intact.

## 2012-10-04 ENCOUNTER — Ambulatory Visit (HOSPITAL_BASED_OUTPATIENT_CLINIC_OR_DEPARTMENT_OTHER): Payer: BC Managed Care – PPO | Admitting: Hematology & Oncology

## 2012-10-04 ENCOUNTER — Ambulatory Visit: Payer: BC Managed Care – PPO

## 2012-10-04 ENCOUNTER — Other Ambulatory Visit (HOSPITAL_BASED_OUTPATIENT_CLINIC_OR_DEPARTMENT_OTHER): Payer: BC Managed Care – PPO | Admitting: Lab

## 2012-10-04 VITALS — BP 128/59 | HR 82 | Temp 98.5°F | Resp 14 | Ht 64.0 in | Wt 129.0 lb

## 2012-10-04 DIAGNOSIS — D45 Polycythemia vera: Secondary | ICD-10-CM

## 2012-10-04 DIAGNOSIS — H35063 Retinal vasculitis, bilateral: Secondary | ICD-10-CM

## 2012-10-04 DIAGNOSIS — R109 Unspecified abdominal pain: Secondary | ICD-10-CM

## 2012-10-04 DIAGNOSIS — Z832 Family history of diseases of the blood and blood-forming organs and certain disorders involving the immune mechanism: Secondary | ICD-10-CM

## 2012-10-04 DIAGNOSIS — H35069 Retinal vasculitis, unspecified eye: Secondary | ICD-10-CM

## 2012-10-04 LAB — CBC WITH DIFFERENTIAL (CANCER CENTER ONLY)
BASO#: 0 10*3/uL (ref 0.0–0.2)
Eosinophils Absolute: 0.1 10*3/uL (ref 0.0–0.5)
HCT: 35 % (ref 34.8–46.6)
HGB: 11.6 g/dL (ref 11.6–15.9)
LYMPH#: 2.2 10*3/uL (ref 0.9–3.3)
MCV: 93 fL (ref 81–101)
MONO#: 0.5 10*3/uL (ref 0.1–0.9)
NEUT%: 59.6 % (ref 39.6–80.0)
WBC: 7.1 10*3/uL (ref 3.9–10.0)

## 2012-10-04 MED ORDER — BELLADONNA ALK-PHENOBARBITAL 16.2 MG PO TABS: ORAL_TABLET | ORAL | Status: DC

## 2012-10-04 NOTE — Progress Notes (Signed)
Pt seen by Dr. Myna Hidalgo, no phlebotomy indicated today.

## 2012-10-04 NOTE — Progress Notes (Signed)
This office note has been dictated.

## 2012-10-15 NOTE — Progress Notes (Signed)
DIAGNOSES: 1. 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 Factor V Leiden mutation with recurrent deep venous     thrombosis/pulmonary embolism. 3. Autoimmune retinal vasculitis.  CURRENT THERAPY: 1. Phlebotomy to maintain hemoglobin below 12. 2. Xarelto 20 mg p.o. q. day. 3. Aspirin 81 mg p.o. q. day.  INTERIM HISTORY:  [name] comes in for followup.  She is having a lot of issues at home.  She is under a lot of stress.  Her daughter, unfortunately, is in jail for drugs.  This certainly is a stress for her.  She is having problems with the vasculitis.  She is now back on steroid eye drops.  She is having a lot of abdominal spasms.  She feels bloated.  She may have an irritable bowel.  I went ahead and gave her a prescription for Donnatal to take.  She has had no problems with leg swelling.  There is still a "knot" in the pretibial region of her left leg.  She has had no bleeding.  There has been no change in bowel or bladder habits.  She is still smoking, but trying to cut back.  Overall, her performance status is ECOG 1.  PHYSICAL EXAMINATION:  General:  This is a well-developed, well- nourished white female in no obvious distress.  Vital signs: Temperature of 98.5, pulse 82, respiratory rate 14, blood pressure 128/59.  Weight is 129.  Head and neck:  Normocephalic, atraumatic skull.  There are no ocular or oral lesions.  There are no palpable cervical or supraclavicular lymph nodes.  Lungs:  Clear bilaterally. Cardiac:  Regular rate and rhythm with a normal S1 and S2.  There are no murmurs, rubs, or bruits.  Abdomen:  Soft.  She has good bowel sounds. There is no fluid wave.  There is no palpable hepatosplenomegaly. Extremities:  No clubbing, cyanosis, or edema.  Neurological:  No focal neurological deficits.  Skin:  No rashes, ecchymoses, or petechiae.  LABORATORY STUDIES:  White cell count is 7.1, hemoglobin 11.6, hematocrit 35, platelet count 267.  IMPRESSION:   Holly Shaffer is a very nice 42 year old white female with polycythemia.  She also has a heterozygous factor V Leiden mutation. Also noted is autoimmune retinal vasculitis.  She says that she is going to go to [facility] for a rheumatology appointment this month.  We do not need to phlebotomize her because her hemoglobin is below 12.  She will continue the Xarelto and aspirin.  Hopefully, the Donnatal will help with the intestinal issues.  We will plan to get her back in 6 more weeks.    ______________________________ Holly Shaffer, M.D. PRE/MEDQ  D:  10/04/2012  T:  10/15/2012  Job:  [id number]

## 2012-11-18 ENCOUNTER — Ambulatory Visit: Payer: BC Managed Care – PPO | Admitting: Hematology & Oncology

## 2012-11-18 ENCOUNTER — Other Ambulatory Visit: Payer: BC Managed Care – PPO | Admitting: Lab

## 2012-11-22 ENCOUNTER — Other Ambulatory Visit (HOSPITAL_BASED_OUTPATIENT_CLINIC_OR_DEPARTMENT_OTHER): Payer: BC Managed Care – PPO | Admitting: Lab

## 2012-11-22 ENCOUNTER — Ambulatory Visit (HOSPITAL_BASED_OUTPATIENT_CLINIC_OR_DEPARTMENT_OTHER): Payer: BC Managed Care – PPO | Admitting: Hematology & Oncology

## 2012-11-22 ENCOUNTER — Ambulatory Visit (HOSPITAL_BASED_OUTPATIENT_CLINIC_OR_DEPARTMENT_OTHER): Payer: BC Managed Care – PPO

## 2012-11-22 VITALS — BP 114/82 | HR 109

## 2012-11-22 VITALS — BP 112/72 | HR 88 | Temp 98.7°F | Resp 14 | Ht 64.0 in | Wt 128.0 lb

## 2012-11-22 DIAGNOSIS — K5904 Chronic idiopathic constipation: Secondary | ICD-10-CM

## 2012-11-22 DIAGNOSIS — I82403 Acute embolism and thrombosis of unspecified deep veins of lower extremity, bilateral: Secondary | ICD-10-CM

## 2012-11-22 DIAGNOSIS — Z86718 Personal history of other venous thrombosis and embolism: Secondary | ICD-10-CM

## 2012-11-22 DIAGNOSIS — Z7901 Long term (current) use of anticoagulants: Secondary | ICD-10-CM

## 2012-11-22 DIAGNOSIS — Z86711 Personal history of pulmonary embolism: Secondary | ICD-10-CM

## 2012-11-22 DIAGNOSIS — Z832 Family history of diseases of the blood and blood-forming organs and certain disorders involving the immune mechanism: Secondary | ICD-10-CM

## 2012-11-22 DIAGNOSIS — K5900 Constipation, unspecified: Secondary | ICD-10-CM

## 2012-11-22 DIAGNOSIS — D45 Polycythemia vera: Secondary | ICD-10-CM

## 2012-11-22 DIAGNOSIS — D6859 Other primary thrombophilia: Secondary | ICD-10-CM

## 2012-11-22 DIAGNOSIS — H35069 Retinal vasculitis, unspecified eye: Secondary | ICD-10-CM

## 2012-11-22 LAB — CBC WITH DIFFERENTIAL (CANCER CENTER ONLY)
BASO%: 0.2 % (ref 0.0–2.0)
EOS%: 1.1 % (ref 0.0–7.0)
HGB: 13.2 g/dL (ref 11.6–15.9)
LYMPH#: 2.6 10*3/uL (ref 0.9–3.3)
MCHC: 33.2 g/dL (ref 32.0–36.0)
MCV: 89 fL (ref 81–101)
MONO#: 0.7 10*3/uL (ref 0.1–0.9)
NEUT#: 5.4 10*3/uL (ref 1.5–6.5)
RBC: 4.49 10*6/uL (ref 3.70–5.32)
RDW: 15.3 % (ref 11.1–15.7)
WBC: 8.8 10*3/uL (ref 3.9–10.0)

## 2012-11-22 MED ORDER — LINACLOTIDE 145 MCG PO CAPS: 145.0000 ug | ORAL_CAPSULE | Freq: Every day | ORAL | Status: DC

## 2012-11-22 NOTE — Progress Notes (Signed)
Holly Shaffer presents today for phlebotomy per MD orders. Phlebotomy procedure started at 1245 and ended at 1255. 500 grams removed. Patient observed for 30 minutes after procedure without any incident. Patient tolerated procedure well. IV needle removed intact.

## 2012-11-22 NOTE — Progress Notes (Signed)
This office note has been dictated.

## 2012-11-22 NOTE — Patient Instructions (Signed)

## 2012-11-24 NOTE — Progress Notes (Signed)
DIAGNOSES: 1. 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 Deep venous thrombosis/pulmonary embolism -- recurrent -- factor V     Leiden, heterozygous. 3. Autoimmune retinal vasculitis.  CURRENT THERAPY: 1. Phlebotomy to maintain hemoglobin below 12. 2. Xarelto 20 mg p.o. daily. 3. Aspirin 81 mg p.o. daily.  INTERIM HISTORY:  Holly Shaffer comes in for followup.  She is doing fairly well.  She does feel tired.  She has a lot of things going on right now.  She has had no problems with nausea or vomiting.  She has had no problems with fever, sweats, or chills.  She has had the eye difficulties, these are no worse.  She is off prednisone, and appears happy about this.  She has had no rashes.  She continues to have problems with her stomach.  She is constipated. She may have some irritable bowel issues.  We will try her on some Linzess.  PHYSICAL EXAMINATION:  General:  This is a well-developed, well- nourished, white female in no obvious distress.  Vital Signs: Temperature of 98.7, pulse 88, respiratory rate 14, blood pressure 112/72, weight is 128 pounds.  Head and Neck:  Normocephalic and atraumatic skull.  She has no ocular or oral lesions.  There is no adenopathy in the neck.  Lungs:  Clear bilaterally.  Cardiac:  Regular rate and rhythm with a normal S1 and S2.  There are no murmurs, rubs or bruits.  Abdomen:  Soft.  She has good bowel sounds.  There is no fluid wave.  There is no palpable abdominal mass.  There is no palpable hepatosplenomegaly.  Back:  No tenderness over the spine, ribs, or hips. Extremities:  No clubbing, cyanosis or edema.  She has good range of motion of her joints.  Skin:  No rashes, ecchymosis, or petechia.  LABORATORY STUDIES:  White cell count is 8.8, hemoglobin 13.2, hematocrit 39.8, platelet count 240.  MCV is 89.  IMPRESSION:  Holly Shaffer is a very nice 42 year old white female.  She has polycythemia vera.  She also has the factor V Leiden  mutation.  We will go ahead and phlebotomize her today.  She feels better with a hemoglobin below 12.  We will continue the Xarelto and aspirin.  We will plan to get her back to see [place] in another couple of months. Hopefully, we will get her through the holidays.    ______________________________ Josph Macho, M.D. PRE/MEDQ  D:  11/22/2012  T:  11/23/2012  Job:  [id number]

## 2012-11-29 ENCOUNTER — Other Ambulatory Visit: Payer: Self-pay | Admitting: Hematology & Oncology

## 2012-12-02 ENCOUNTER — Encounter: Payer: Self-pay | Admitting: *Deleted

## 2012-12-02 DIAGNOSIS — K589 Irritable bowel syndrome without diarrhea: Secondary | ICD-10-CM | POA: Insufficient documentation

## 2012-12-03 ENCOUNTER — Telehealth: Payer: Self-pay | Admitting: *Deleted

## 2012-12-03 NOTE — Telephone Encounter (Signed)
Linzess prescribed by Dr Myna Hidalgo required prior authorization and subsequently denied due to lack of "documentation of symptoms for at least 3 months or longer is required. Patient must have had a trial and failure of; allergy to; contraindication to; or intolerance to at least (?) prior therapies". The last part of the sentence was cut off. Left a message on the pt's vm stating that Dr Myna Hidalgo doesn't have enough information to support what the insurance company is requiring and will need to contact a PCP or GI MD for further guidance. To call back if further questions.

## 2012-12-04 ENCOUNTER — Other Ambulatory Visit: Payer: Self-pay | Admitting: Hematology & Oncology

## 2012-12-23 NOTE — Progress Notes (Signed)
DIAGNOSES: 1. 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 Factor V Leiden mutation with recurrent deep venous     thrombosis/pulmonary embolism. 3. Autoimmune retinal vasculitis.  CURRENT THERAPY: 1. Phlebotomy to maintain hemoglobin below 12. 2. Xarelto 20 mg p.o. daily. 3. Aspirin 81 mg p.o. daily.  INTERIM HISTORY:  Holly Shaffer comes in for followup.  She is having a lot of issues at home.  She is under a lot of stress.  Her daughter, unfortunately, is in jail for drugs.  This certainly is a stress for her.  She is having problems with the vasculitis.  She is now back on steroid eyedrops.  She is having a lot of abdominal spasm.  She feels bloated.  She may have irritable bowel.  I went ahead and gave her a prescription for Donnatal to take.  She has had no problems with leg swelling.  There is still a "knot" in the pretibial region of her left leg.  She has had no bleeding.  There has been no change in bowel or bladder habits.  She is still smoking but trying to cut back.  Overall, her performance status is ECOG 1.  PHYSICAL EXAMINATION:  General:  This is a well-developed, well- nourished white female in no obvious distress.  Vital signs: Temperature of 98.5, pulse 82, respiratory rate 14, blood pressure 128/59.  Weight is 129.  Head and Neck:  Normocephalic, atraumatic skull.  There are no ocular or oral lesions.  There are no palpable cervical or supraclavicular lymph nodes.  Lungs:  Clear bilaterally. Cardiac:  Regular rate and rhythm with normal S1 and S2.  There are no murmurs, rubs, or bruits.  Abdomen:  Soft.  She has good bowel sounds. There is no fluid wave.  There is no palpable hepatosplenomegaly. Extremities:  No clubbing, cyanosis, or edema.  Neurologic:  No focal neurological deficits.  Skin:  No rashes, ecchymoses, or petechiae.  LABORATORY STUDIES:  White cell count is 7.1, hemoglobin 11.6, hematocrit 35, platelet count 267.  IMPRESSION:  Holly Shaffer is  a very nice 42 year old white female with polycythemia.  She also has heterozygous factor V Leiden mutation.  Also noted is autoimmune retinal vasculitis.  She says that she is going to go to [facility] for a rheumatology appointment this month.  We do not need to phlebotomize her because her hemoglobin was below 12.  She will continue the Xarelto and aspirin.  Hopefully, the Donnatal will help with the intestinal issues.  We will plan to get her back in 6 more weeks.    ______________________________ Josph Macho, M.D. PRE/MEDQ  D:  10/04/2012  T:  12/23/2012  Job:  [id number]

## 2013-01-03 ENCOUNTER — Ambulatory Visit (HOSPITAL_BASED_OUTPATIENT_CLINIC_OR_DEPARTMENT_OTHER): Payer: BC Managed Care – PPO | Admitting: Hematology & Oncology

## 2013-01-03 ENCOUNTER — Ambulatory Visit: Payer: BC Managed Care – PPO

## 2013-01-03 ENCOUNTER — Other Ambulatory Visit (HOSPITAL_BASED_OUTPATIENT_CLINIC_OR_DEPARTMENT_OTHER): Payer: BC Managed Care – PPO | Admitting: Lab

## 2013-01-03 VITALS — BP 126/71 | HR 89 | Temp 98.8°F | Resp 14 | Ht 64.0 in | Wt 127.0 lb

## 2013-01-03 DIAGNOSIS — H35069 Retinal vasculitis, unspecified eye: Secondary | ICD-10-CM

## 2013-01-03 DIAGNOSIS — F17200 Nicotine dependence, unspecified, uncomplicated: Secondary | ICD-10-CM

## 2013-01-03 DIAGNOSIS — D6859 Other primary thrombophilia: Secondary | ICD-10-CM

## 2013-01-03 DIAGNOSIS — K5904 Chronic idiopathic constipation: Secondary | ICD-10-CM

## 2013-01-03 DIAGNOSIS — Z9289 Personal history of other medical treatment: Secondary | ICD-10-CM

## 2013-01-03 DIAGNOSIS — Z86718 Personal history of other venous thrombosis and embolism: Secondary | ICD-10-CM

## 2013-01-03 DIAGNOSIS — D45 Polycythemia vera: Secondary | ICD-10-CM

## 2013-01-03 DIAGNOSIS — I82402 Acute embolism and thrombosis of unspecified deep veins of left lower extremity: Secondary | ICD-10-CM

## 2013-01-03 DIAGNOSIS — I82403 Acute embolism and thrombosis of unspecified deep veins of lower extremity, bilateral: Secondary | ICD-10-CM

## 2013-01-03 LAB — CBC WITH DIFFERENTIAL (CANCER CENTER ONLY)
BASO#: 0 10*3/uL (ref 0.0–0.2)
BASO%: 0.3 % (ref 0.0–2.0)
Eosinophils Absolute: 0.1 10*3/uL (ref 0.0–0.5)
HGB: 12.2 g/dL (ref 11.6–15.9)
LYMPH#: 2.6 10*3/uL (ref 0.9–3.3)
MCV: 86 fL (ref 81–101)
MONO#: 0.6 10*3/uL (ref 0.1–0.9)
MONO%: 6 % (ref 0.0–13.0)
NEUT#: 6.2 10*3/uL (ref 1.5–6.5)
Platelets: 256 10*3/uL (ref 145–400)
RBC: 4.37 10*6/uL (ref 3.70–5.32)

## 2013-01-03 NOTE — Progress Notes (Signed)
This office note has been dictated.

## 2013-01-04 NOTE — Progress Notes (Signed)
DIAGNOSES: 1. Polycythemia vera-JAK2 negative. 2. Heterozygous factor V Leiden mutation with subsequent recurrent     DVT/PE. 3. Autoimmune retinal vasculitis.  CURRENT THERAPY: 1. Phlebotomy to maintain hemoglobin below 12. 2. Xarelto 20 mg p.o. daily. 3. Aspirin 81 mg p.o. daily.  INTERIM HISTORY:  Holly Shaffer comes in for followup.  She has been doing okay.  She is having more problems with her eyes, unfortunately.  She sees Dr. Everlena Cooper at [facility] in a couple weeks.  She does take some steroid eyedrops which have helped.  She is still bothered by her left lower leg.  She says there is some continued swelling.  We will get a Doppler of that leg.  I do not think she would have a DVT that would be acute.  There may be some chronic.  She has had no abdominal pain.  She does state some pain under the axilla bilaterally.  She has not had a mammogram in a 4 years.  Of course, she does not have a primary doctor.  We tried to get her to get a primary doctor.  This has been difficult for her to do.  She is still smoking.  She is less stressed home, thankfully, as her daughter is now moved to her floor.  PHYSICAL EXAMINATION:  General:  This is a fairly well developed, well- nourished white female in no obvious distress.  Vital Signs: Temperature of 98.8, pulse 89, respiratory rate 14, blood pressure 126/71, weight is 127 pounds.  Head and Neck:  Normocephalic, atraumatic skull.  There are no ocular or oral lesions.  There are no palpable cervical or supraclavicular lymph nodes.  Lungs:  Clear bilaterally. Cardiac:  Regular rate and rhythm with a normal S1, S2.  She has no murmurs, rubs, or bruits.  Axillary:  No obvious axillary adenopathy bilaterally.  There is some tenderness to palpation in the axilla bilaterally.  Back:  No tenderness over the spine, ribs, or hips. Extremities:  Some slight nonpitting edema of the left lower leg.  No venous cord is noted.  There is no erythema.   There is some tenderness over the tibia in the upper 3rd of the tibia.  There is no venous cord in the inner left thigh.  Right leg is unremarkable.  Skin:  No rashes. Neurological:  No focal neurological deficits.  LABORATORY STUDIES:  White cell count is 9.5, hemoglobin 12.2, hematocrit 37.4, platelet count is 255.  IMPRESSION:  Holly Shaffer is a very nice 42 year old white female.  She has multiple hematologic issues.  She has polycythemia, although she is JAK2 negative.  Her smoking certainly may be a part of this.  However, we are keeping her hemoglobin below 12.  Her hemoglobin is close enough right now that we can hold off on phlebotomizing her.  Her active problem now really is this retinal vasculitis.  She will see the doctors at [facility].  She may be put on Imuran.  She continues the Xarelto and aspirin.  I think this has been helpful for her.  I do not think she has a DVT in the left leg.  If so, this is chronic.  We will get another Doppler.  We will get this on Monday.  Again, she really needs to have a family doctor to try to help manage some of her other issues.  Unfortunately, I do not know if she will do this.  As such, I feel that we have to try to help her out as much  as we can.  We will go ahead and plan to see her back ourselves in 6 weeks.  I will see about trying to get a mammogram for her.  We will see if she will follow-through.    ______________________________ Josph Macho, M.D. PRE/MEDQ  D:  01/03/2013  T:  01/04/2013  Job:  [id number]

## 2013-01-06 ENCOUNTER — Ambulatory Visit (HOSPITAL_BASED_OUTPATIENT_CLINIC_OR_DEPARTMENT_OTHER): Payer: BC Managed Care – PPO

## 2013-01-07 ENCOUNTER — Ambulatory Visit (HOSPITAL_BASED_OUTPATIENT_CLINIC_OR_DEPARTMENT_OTHER)
Admission: RE | Admit: 2013-01-07 | Discharge: 2013-01-07 | Disposition: A | Discharge status: Home / Self Care | Payer: BC Managed Care – PPO | Source: Ambulatory Visit | Attending: Hematology & Oncology | Admitting: Hematology & Oncology

## 2013-01-07 DIAGNOSIS — M7989 Other specified soft tissue disorders: Secondary | ICD-10-CM | POA: Insufficient documentation

## 2013-01-07 DIAGNOSIS — I82402 Acute embolism and thrombosis of unspecified deep veins of left lower extremity: Secondary | ICD-10-CM

## 2013-01-08 ENCOUNTER — Telehealth: Payer: Self-pay | Admitting: Nurse Practitioner

## 2013-01-08 NOTE — Telephone Encounter (Addendum)
Message copied by Glee Arvin on Wed Jan 08, 2013  9:50 AM ------      Message from: Josph Macho      Created: Tue Jan 07, 2013 10:30 PM       Call - no blood clot!!  Cindee Lame ------LVM on pt's personal machine and instructed [place] to contact our office at her earliest convience.

## 2013-01-13 ENCOUNTER — Encounter: Payer: Self-pay | Admitting: Nurse Practitioner

## 2013-01-13 NOTE — Progress Notes (Signed)
Spoke w/pt and informed her that Per message left on 01/07/13 her U/S showed no evidence of a blood clot per Dr. Myna Hidalgo. Pt verbalized understanding and appreciation.Marland Kitchen

## 2013-01-24 ENCOUNTER — Other Ambulatory Visit: Payer: Self-pay | Admitting: Hematology & Oncology

## 2013-02-06 ENCOUNTER — Other Ambulatory Visit: Payer: Self-pay | Admitting: *Deleted

## 2013-02-06 DIAGNOSIS — D45 Polycythemia vera: Secondary | ICD-10-CM

## 2013-02-07 ENCOUNTER — Other Ambulatory Visit: Payer: BC Managed Care – PPO | Admitting: Lab

## 2013-02-07 ENCOUNTER — Ambulatory Visit: Payer: BC Managed Care – PPO | Admitting: Hematology & Oncology

## 2013-02-10 ENCOUNTER — Other Ambulatory Visit: Payer: Self-pay | Admitting: *Deleted

## 2013-02-10 ENCOUNTER — Ambulatory Visit (HOSPITAL_BASED_OUTPATIENT_CLINIC_OR_DEPARTMENT_OTHER): Payer: BC Managed Care – PPO | Admitting: Hematology & Oncology

## 2013-02-10 ENCOUNTER — Ambulatory Visit (HOSPITAL_BASED_OUTPATIENT_CLINIC_OR_DEPARTMENT_OTHER)
Admission: RE | Admit: 2013-02-10 | Discharge: 2013-02-10 | Disposition: A | Discharge status: Home / Self Care | Payer: BC Managed Care – PPO | Source: Ambulatory Visit | Attending: Hematology & Oncology | Admitting: Hematology & Oncology

## 2013-02-10 ENCOUNTER — Encounter: Payer: Self-pay | Admitting: Hematology & Oncology

## 2013-02-10 ENCOUNTER — Other Ambulatory Visit (HOSPITAL_BASED_OUTPATIENT_CLINIC_OR_DEPARTMENT_OTHER): Payer: BC Managed Care – PPO | Admitting: Lab

## 2013-02-10 VITALS — BP 113/71 | HR 90 | Temp 98.7°F | Resp 14 | Ht 64.0 in | Wt 124.0 lb

## 2013-02-10 DIAGNOSIS — Z86718 Personal history of other venous thrombosis and embolism: Secondary | ICD-10-CM

## 2013-02-10 DIAGNOSIS — H35069 Retinal vasculitis, unspecified eye: Secondary | ICD-10-CM

## 2013-02-10 DIAGNOSIS — D45 Polycythemia vera: Secondary | ICD-10-CM

## 2013-02-10 DIAGNOSIS — Z7901 Long term (current) use of anticoagulants: Secondary | ICD-10-CM

## 2013-02-10 DIAGNOSIS — G4701 Insomnia due to medical condition: Secondary | ICD-10-CM

## 2013-02-10 DIAGNOSIS — Z832 Family history of diseases of the blood and blood-forming organs and certain disorders involving the immune mechanism: Secondary | ICD-10-CM

## 2013-02-10 DIAGNOSIS — Z9289 Personal history of other medical treatment: Secondary | ICD-10-CM

## 2013-02-10 DIAGNOSIS — Z1231 Encounter for screening mammogram for malignant neoplasm of breast: Secondary | ICD-10-CM | POA: Insufficient documentation

## 2013-02-10 DIAGNOSIS — F17200 Nicotine dependence, unspecified, uncomplicated: Secondary | ICD-10-CM

## 2013-02-10 LAB — CBC WITH DIFFERENTIAL (CANCER CENTER ONLY)
BASO#: 0 10*3/uL (ref 0.0–0.2)
BASO%: 0.2 % (ref 0.0–2.0)
EOS ABS: 0.1 10*3/uL (ref 0.0–0.5)
EOS%: 0.6 % (ref 0.0–7.0)
HCT: 41.7 % (ref 34.8–46.6)
HEMOGLOBIN: 13.7 g/dL (ref 11.6–15.9)
LYMPH#: 2.1 10*3/uL (ref 0.9–3.3)
LYMPH%: 24.3 % (ref 14.0–48.0)
MCH: 28.1 pg (ref 26.0–34.0)
MCHC: 32.9 g/dL (ref 32.0–36.0)
MCV: 86 fL (ref 81–101)
MONO#: 0.7 10*3/uL (ref 0.1–0.9)
MONO%: 7.5 % (ref 0.0–13.0)
NEUT#: 5.9 10*3/uL (ref 1.5–6.5)
NEUT%: 67.4 % (ref 39.6–80.0)
PLATELETS: 212 10*3/uL (ref 145–400)
RBC: 4.87 10*6/uL (ref 3.70–5.32)
RDW: 16.7 % — ABNORMAL HIGH (ref 11.1–15.7)
WBC: 8.7 10*3/uL (ref 3.9–10.0)

## 2013-02-10 MED ORDER — ZOLPIDEM TARTRATE 10 MG PO TABS: 10.0000 mg | ORAL_TABLET | Freq: Every evening | ORAL | Status: DC | PRN

## 2013-02-10 NOTE — Patient Instructions (Signed)
You Can Quit Smoking If you are ready to quit smoking or are thinking about it, congratulations! You have chosen to help yourself be healthier and live longer! There are lots of different ways to quit smoking. Nicotine gum, nicotine patches, a nicotine inhaler, or nicotine nasal spray can help with physical craving. Hypnosis, support groups, and medicines help break the habit of smoking. TIPS TO GET OFF AND STAY OFF CIGARETTES  Learn to predict your moods. Do not let a bad situation be your excuse to have a cigarette. Some situations in your life might tempt you to have a cigarette.  Ask friends and co-workers not to smoke around you.  Make your home smoke-free.  Never have "just one" cigarette. It leads to wanting another and another. Remind yourself of your decision to quit.  On a card, make a list of your reasons for not smoking. Read it at least the same number of times a day as you have a cigarette. Tell yourself everyday, "I do not want to smoke. I choose not to smoke."  Ask someone at home or work to help you with your plan to quit smoking.  Have something planned after you eat or have a cup of coffee. Take a walk or get other exercise to perk you up. This will help to keep you from overeating.  Try a relaxation exercise to calm you down and decrease your stress. Remember, you may be tense and nervous the first two weeks after you quit. This will pass.  Find new activities to keep your hands busy. Play with a pen, coin, or rubber band. Doodle or draw things on paper.  Brush your teeth right after eating. This will help cut down the craving for the taste of tobacco after meals. You can try mouthwash too.  Try gum, breath mints, or diet candy to keep something in your mouth. IF YOU SMOKE AND WANT TO QUIT:  Do not stock up on cigarettes. Never buy a carton. Wait until one pack is finished before you buy another.  Never carry cigarettes with you at work or at home.  Keep cigarettes  as far away from you as possible. Leave them with someone else.  Never carry matches or a lighter with you.  Ask yourself, "Do I need this cigarette or is this just a reflex?"  Bet with someone that you can quit. Put cigarette money in a piggy bank every morning. If you smoke, you give up the money. If you do not smoke, by the end of the week, you keep the money.  Keep trying. It takes 21 days to change a habit!  Talk to your doctor about using medicines to help you quit. These include nicotine replacement gum, lozenges, or skin patches. Document Released: 10/29/2008 Document Revised: 03/27/2011 Document Reviewed: 10/29/2008 [employer] Patient Information 2014 [employer].  

## 2013-02-10 NOTE — Progress Notes (Signed)
This office note has been dictated.

## 2013-02-11 ENCOUNTER — Other Ambulatory Visit: Payer: Self-pay | Admitting: Hematology & Oncology

## 2013-02-11 DIAGNOSIS — R928 Other abnormal and inconclusive findings on diagnostic imaging of breast: Secondary | ICD-10-CM

## 2013-02-11 NOTE — Progress Notes (Signed)
DIAGNOSES: 1. 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 Autoimmune retinal vasculitis. 3. 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  CURRENT THERAPY: 1. Phlebotomy to maintain hemoglobin less than 12. 2. Xarelto 20 mg p.o. daily. 3. Aspirin 81 mg p.o. daily.  INTERIM HISTORY:  Holly Shaffer comes in for followup.  She was seen out at [facility].  She sees Dr. Tomi Likens out at [facility].  She also will be seeing a rheumatologist out there.  She did see the ophthalmologist on December 31st.  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  I think she is on steroid eye drops.  Again, she will be seeing the rheumatologist out there.  She has done well with respect to the polycythemia and her history of DVT.  She has had no problems of bleeding.  She is phlebotomized with a hemoglobin above 12.  She feels a lot better with a hemoglobin below 12.  We did do a Doppler of the left lower leg in December.  Nothing was noted with respect to deep venous thrombus.  She had a little bit of localized swelling in the left lower leg, but this was not felt to be any clinically significant problem.  She is still smoking.  She is trying to cut back.  She is looking for a new job.  She is happy that she left her old job and starting new.  She still has abdominal issues.  PHYSICAL EXAMINATION:  General:  This is a fairly well-developed, well- nourished white female, in no obvious distress.  Vital Signs: Temperature of 98.7, pulse 90, respiratory rate 16, blood pressure 113/71, weight is 124 pounds.  Head and Neck:  Normocephalic, atraumatic skull.  There are no ocular or oral lesions.  There are no palpable cervical or supraclavicular lymph nodes.  Lungs:  Clear bilaterally. Cardiac:  Regular rate and rhythm with normal S1, S2.  There are no murmurs, rubs, or bruits.  Abdomen:  Soft.  She has good bowel sounds. There is no fluid wave.   There is no palpable abdominal mass.  There is no palpable hepatosplenomegaly.  Back:  No tenderness over the spine, ribs, or hips.  Extremities:  Show no clubbing, cyanosis, or edema.  She has good range of motion of her joints.  Neurological:  No focal neurological deficits.  Skin:  No rashes, ecchymosis, or petechiae.  LABORATORY STUDIES:  White cell count 8.7, hemoglobin 13.7, hematocrit 41.7, platelet count 212,000.  IMPRESSION:  Holly Shaffer is a very nice 43 year old white female.  She has a retinal vasculitis.  She has anti-retinal antibodies.  She has a fairly decent vision despite this.  I think we make sure that her hemoglobin stays below 12, this will minimize any kind of hypercoagulable type state that she may encounter.  She is on Xarelto.  She will be on lifelong Xarelto.  She has done well with Xarelto.  She is also on baby aspirin.  I think we are doing everything that we can do to try to minimize her risk of thromboembolic disease.  Of note, she did have a mammogram done today.  I do not have the results back.  Hopefully, this will be unremarkable.  She has not had one for 4 years.  I will plan to see her back in another month or so for followup.    ______________________________ Volanda Napoleon, M.D. PRE/MEDQ  D:  02/10/2013  T:  02/11/2013  Job:  [id number]

## 2013-02-12 ENCOUNTER — Encounter: Payer: Self-pay | Admitting: *Deleted

## 2013-02-12 NOTE — Progress Notes (Signed)
Pt called asking for results of her Mammogram done 02/10/13.  Per Dr. Marin Olp, told that the left breast is a little more dense than the right and they want to evaluate that with an ultrarsound.  Pt voices understanding.

## 2013-02-17 ENCOUNTER — Telehealth: Payer: Self-pay | Admitting: Hematology & Oncology

## 2013-02-17 NOTE — Telephone Encounter (Signed)
Pt moved 2-3 to 2-5

## 2013-02-20 ENCOUNTER — Ambulatory Visit (HOSPITAL_BASED_OUTPATIENT_CLINIC_OR_DEPARTMENT_OTHER): Payer: BC Managed Care – PPO

## 2013-02-20 ENCOUNTER — Ambulatory Visit
Admission: RE | Admit: 2013-02-20 | Discharge: 2013-02-20 | Disposition: A | Discharge status: Home / Self Care | Payer: BC Managed Care – PPO | Source: Ambulatory Visit | Attending: Hematology & Oncology | Admitting: Hematology & Oncology

## 2013-02-20 VITALS — BP 107/73 | HR 94 | Temp 98.6°F | Resp 18

## 2013-02-20 DIAGNOSIS — R928 Other abnormal and inconclusive findings on diagnostic imaging of breast: Secondary | ICD-10-CM

## 2013-02-20 DIAGNOSIS — Z832 Family history of diseases of the blood and blood-forming organs and certain disorders involving the immune mechanism: Secondary | ICD-10-CM

## 2013-02-20 DIAGNOSIS — D45 Polycythemia vera: Secondary | ICD-10-CM

## 2013-02-20 DIAGNOSIS — I82409 Acute embolism and thrombosis of unspecified deep veins of unspecified lower extremity: Secondary | ICD-10-CM

## 2013-02-20 DIAGNOSIS — H35069 Retinal vasculitis, unspecified eye: Secondary | ICD-10-CM

## 2013-02-20 NOTE — Progress Notes (Signed)
Holly Shaffer presents today for phlebotomy per MD orders. Phlebotomy procedure started at 0945 and ended at 1000. 500 ml removed. Patient observed for 30 minutes after procedure without any incident. Patient tolerated procedure well. IV needle removed intact.

## 2013-02-20 NOTE — Patient Instructions (Signed)

## 2013-03-17 ENCOUNTER — Telehealth: Payer: Self-pay | Admitting: Hematology & Oncology

## 2013-03-17 ENCOUNTER — Other Ambulatory Visit: Payer: BC Managed Care – PPO | Admitting: Lab

## 2013-03-17 ENCOUNTER — Ambulatory Visit: Payer: BC Managed Care – PPO | Admitting: Hematology & Oncology

## 2013-03-17 NOTE — Telephone Encounter (Signed)
Patient called cx 03/17/13 apt and stated she will call back to resch.  Holly Shaffer was notified of cx apt

## 2013-03-27 ENCOUNTER — Telehealth: Payer: Self-pay | Admitting: Hematology & Oncology

## 2013-03-27 NOTE — Telephone Encounter (Signed)
Pt left message wanted to come in for paperwork and phlebotomy. I left message for her to call back and ask for the nurse's because they need to triage her

## 2013-03-31 ENCOUNTER — Ambulatory Visit (HOSPITAL_BASED_OUTPATIENT_CLINIC_OR_DEPARTMENT_OTHER): Payer: BC Managed Care – PPO

## 2013-03-31 VITALS — BP 127/87 | HR 95 | Temp 99.3°F | Resp 20

## 2013-03-31 DIAGNOSIS — Z832 Family history of diseases of the blood and blood-forming organs and certain disorders involving the immune mechanism: Secondary | ICD-10-CM

## 2013-03-31 DIAGNOSIS — D45 Polycythemia vera: Secondary | ICD-10-CM

## 2013-03-31 DIAGNOSIS — H35069 Retinal vasculitis, unspecified eye: Secondary | ICD-10-CM

## 2013-03-31 DIAGNOSIS — I82409 Acute embolism and thrombosis of unspecified deep veins of unspecified lower extremity: Secondary | ICD-10-CM

## 2013-03-31 NOTE — Progress Notes (Signed)
Holly Shaffer presents today for phlebotomy per MD orders. Phlebotomy procedure started at 1121 and ended at 1132. Approximately 500 mls removed. Patient observed for 30 minutes after procedure without any incident. Patient tolerated procedure well. IV needle removed intact.

## 2013-03-31 NOTE — Patient Instructions (Signed)

## 2013-04-02 ENCOUNTER — Telehealth: Payer: Self-pay | Admitting: Hematology & Oncology

## 2013-04-02 NOTE — Telephone Encounter (Signed)
Pt aware I cannot e-mail [employer] assistance form. Per her request I mailed it

## 2013-05-05 ENCOUNTER — Ambulatory Visit: Payer: BC Managed Care – PPO | Admitting: Hematology & Oncology

## 2013-05-05 ENCOUNTER — Other Ambulatory Visit: Payer: BC Managed Care – PPO | Admitting: Lab

## 2013-05-05 ENCOUNTER — Telehealth: Payer: Self-pay | Admitting: Hematology & Oncology

## 2013-05-05 NOTE — Telephone Encounter (Signed)
Pt left meesage to reschedule. I left her message to call.

## 2013-05-12 ENCOUNTER — Telehealth: Payer: Self-pay | Admitting: Hematology & Oncology

## 2013-05-12 NOTE — Telephone Encounter (Signed)
Pt made 4-30 appointment

## 2013-05-15 ENCOUNTER — Other Ambulatory Visit (HOSPITAL_BASED_OUTPATIENT_CLINIC_OR_DEPARTMENT_OTHER): Payer: Self-pay | Admitting: Lab

## 2013-05-15 ENCOUNTER — Encounter: Payer: Self-pay | Admitting: Hematology & Oncology

## 2013-05-15 ENCOUNTER — Telehealth: Payer: Self-pay | Admitting: Hematology & Oncology

## 2013-05-15 ENCOUNTER — Ambulatory Visit (HOSPITAL_BASED_OUTPATIENT_CLINIC_OR_DEPARTMENT_OTHER): Payer: BC Managed Care – PPO | Admitting: Hematology & Oncology

## 2013-05-15 ENCOUNTER — Ambulatory Visit: Payer: BC Managed Care – PPO

## 2013-05-15 VITALS — BP 103/68 | HR 86 | Temp 98.2°F | Resp 14 | Ht 64.0 in | Wt 129.0 lb

## 2013-05-15 DIAGNOSIS — Z832 Family history of diseases of the blood and blood-forming organs and certain disorders involving the immune mechanism: Secondary | ICD-10-CM

## 2013-05-15 DIAGNOSIS — D45 Polycythemia vera: Secondary | ICD-10-CM

## 2013-05-15 DIAGNOSIS — D6859 Other primary thrombophilia: Secondary | ICD-10-CM

## 2013-05-15 DIAGNOSIS — H35069 Retinal vasculitis, unspecified eye: Secondary | ICD-10-CM

## 2013-05-15 DIAGNOSIS — Z86711 Personal history of pulmonary embolism: Secondary | ICD-10-CM

## 2013-05-15 DIAGNOSIS — Z86718 Personal history of other venous thrombosis and embolism: Secondary | ICD-10-CM

## 2013-05-15 DIAGNOSIS — G4701 Insomnia due to medical condition: Secondary | ICD-10-CM

## 2013-05-15 LAB — CBC WITH DIFFERENTIAL (CANCER CENTER ONLY)
BASO#: 0 10*3/uL (ref 0.0–0.2)
BASO%: 0.2 % (ref 0.0–2.0)
EOS%: 0.5 % (ref 0.0–7.0)
Eosinophils Absolute: 0 10*3/uL (ref 0.0–0.5)
HCT: 36.8 % (ref 34.8–46.6)
HGB: 12 g/dL (ref 11.6–15.9)
LYMPH#: 1.9 10*3/uL (ref 0.9–3.3)
LYMPH%: 21.9 % (ref 14.0–48.0)
MCH: 27.8 pg (ref 26.0–34.0)
MCHC: 32.6 g/dL (ref 32.0–36.0)
MCV: 85 fL (ref 81–101)
MONO#: 0.8 10*3/uL (ref 0.1–0.9)
MONO%: 9 % (ref 0.0–13.0)
NEUT#: 6 10*3/uL (ref 1.5–6.5)
NEUT%: 68.4 % (ref 39.6–80.0)
PLATELETS: 313 10*3/uL (ref 145–400)
RBC: 4.31 10*6/uL (ref 3.70–5.32)
RDW: 14.5 % (ref 11.1–15.7)
WBC: 8.8 10*3/uL (ref 3.9–10.0)

## 2013-05-15 NOTE — Telephone Encounter (Signed)
Pt in ofc today w no insurance. I gave pt a [facility] application to complete and return.

## 2013-05-16 NOTE — Progress Notes (Signed)
Hematology and Oncology Follow Up Visit  Holly Shaffer [id number] [birth year] 43 y.o. 05/16/2013   Principle Diagnosis:  1. 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 Autoimmune retinal vasculitis. 3. 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  Current Therapy:   . Phlebotomy to maintain hemoglobin less than 12. 2. Xarelto 20 mg p.o. daily. 3. Aspirin 81 mg p.o. daily.     Interim History:  [name] is back for followup appear last saw her back in January. She still looking for a job.  Her vision is getting worse. Patient was unassayed the ophthalmologist at [facility] soon. She is off steroids.  She feels tired. She is had no fever. She's had no bleeding. He's had no issues with a Xarelto or aspirin.  We typically are pretty aggressive with phlebotomizing her. When last checked her iron back in December, her ferritin was only 11.  Medications: Current outpatient prescriptions:Famotidine (PEPCID AC PO), Take by mouth as needed. , Disp: , Rfl: ;  XARELTO 20 MG TABS tablet, TAKE 20 MG (1 TABLET) BY MOUTH DAILY AT 12 NOON., Disp: 30 tablet, Rfl: 5;  zolpidem (AMBIEN) 10 MG tablet, Take 1 tablet (10 mg total) by mouth at bedtime as needed for sleep., Disp: 30 tablet, Rfl: 3  Allergies:  Allergies  Allergen Reactions  . Bactrim Diarrhea and Nausea Only    Past Medical History, Surgical history, Social history, and Family History were reviewed and updated.  Review of Systems: As above  Physical Exam:  height is 5\' 4"  (1.626 m) and weight is 129 lb (58.514 kg). Her oral temperature is 98.2 F (36.8 C). Her blood pressure is 103/68 and her pulse is 86. Her respiration is 14.   Well-developed well-nourished. Lungs are clear. Cardiac exam regular in rhythm. Abdomen soft. Good bowel sounds. There is no fluid wave. There is no palpable liver or spleen tip. Back exam no tenderness over the spine ribs or hips. Extremities shows no  clubbing cyanosis or edema. No venous cord is noted in the legs. Skin exam no rashes. Neurological exam nonfocal.  Lab Results  Component Value Date   WBC 8.8 05/15/2013   HGB 12.0 05/15/2013   HCT 36.8 05/15/2013   MCV 85 05/15/2013   PLT 313 05/15/2013     Chemistry   No results found for this basename: NA,  K,  CL,  CO2,  BUN,  CREATININE,  GLU   No results found for this basename: CALCIUM,  ALKPHOS,  AST,  ALT,  BILITOT         Impression and Plan: Holly Shaffer is 43 year old white female. She has history of polycythemia. We are phlebotomizing her appears like to have her hemoglobin below 12. She feels a lot better when her hemoglobin is this low.  I do feel bad that she has a visual problems. She's had a very extensive workup at [facility] by the ophthalmologist.  She does have a factor V Leiden mutation. She is heterozygous for this. She is a lifelong anticoagulation. She's had no problems since being on anticoagulation.  Will plan to get her back to see [place] in another 4-6 weeks.   Volanda Napoleon, MD 5/1/20156:20 PM

## 2013-06-04 IMAGING — US US EXTREM LOW VENOUS BILAT
1 series · 14 of 24 positions shown · non-contrast
Comparison: None.

CLINICAL DATA: Bilateral leg pain and foot swelling with history of
DVT and polycythemia.

VENOUS DUPLEX ULTRASOUND OF BILATERAL LOWER EXTREMITIES
TECHNIQUE: Gray-scale sonography with graded compression, as well
as color Doppler and duplex ultrasound, were performed to evaluate
the deep venous system of both lower extremities from the level of
the common femoral vein through the popliteal and proximal calf
veins.  Spectral Doppler was utilized to evaluate flow at rest and
with distal augmentation maneuvers.

[Series 1: us extrem low venous bilat · 14 of 40 slices shown]
[im 1/40]
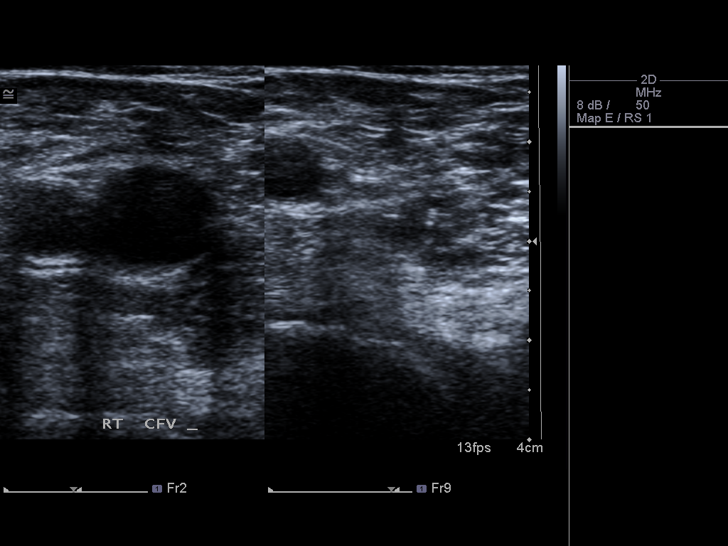
[im 4/40]
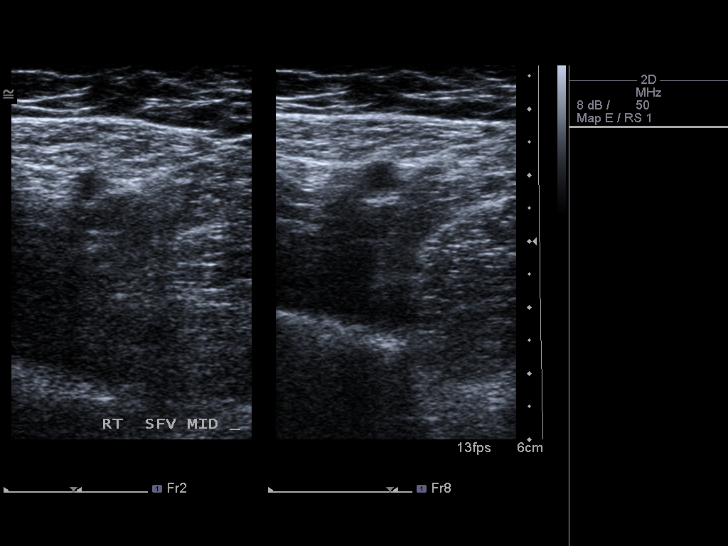
[im 7/40]
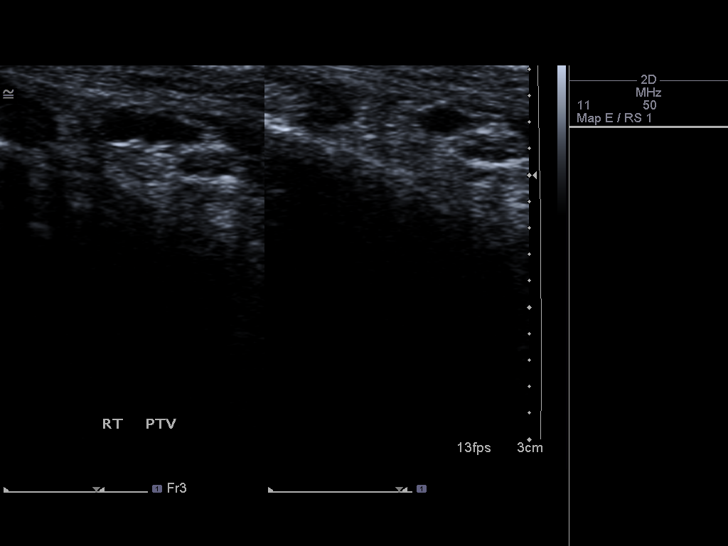
[im 11/40]
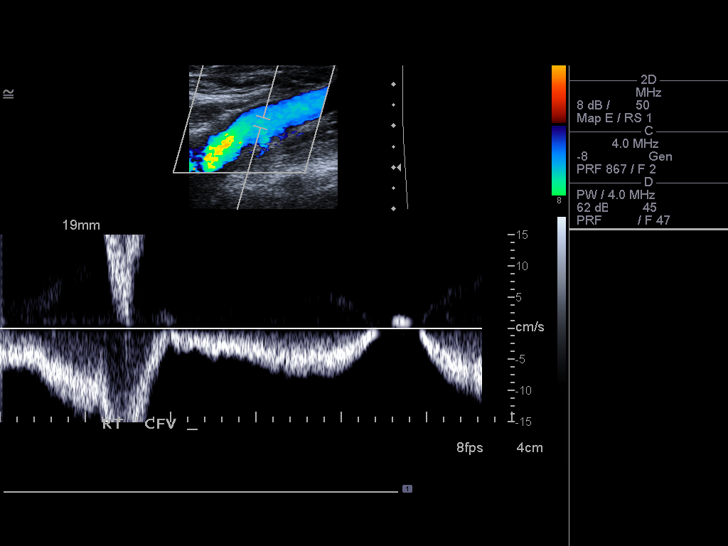
[im 12/40]
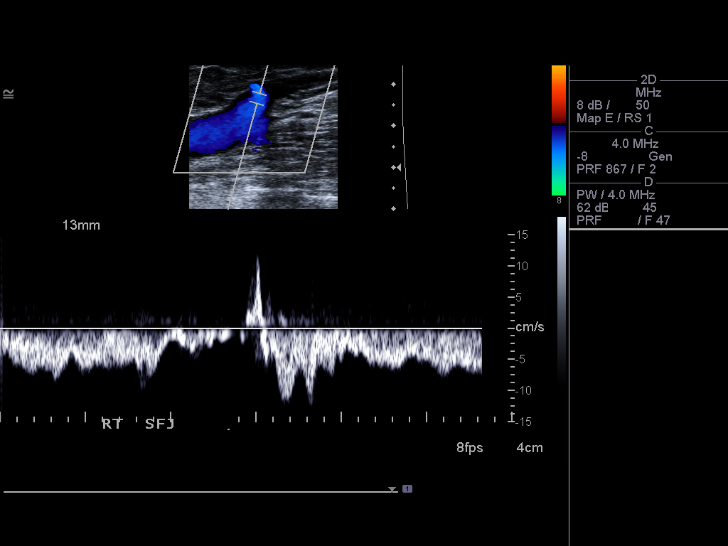
[im 16/40]
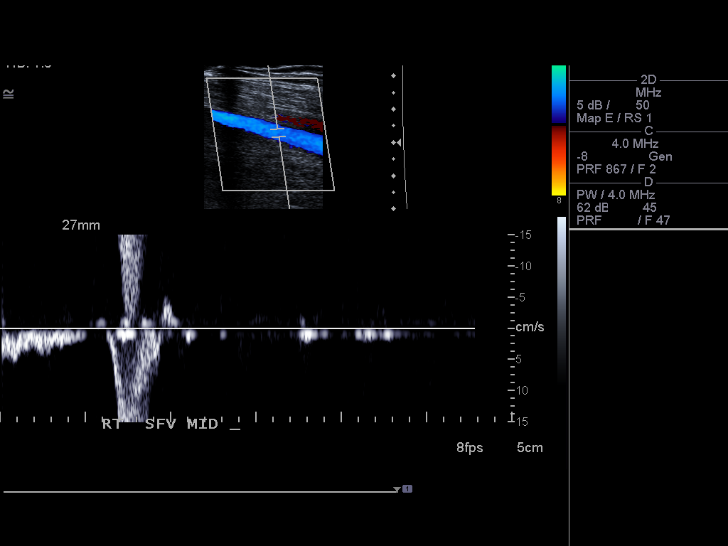
[im 19/40]
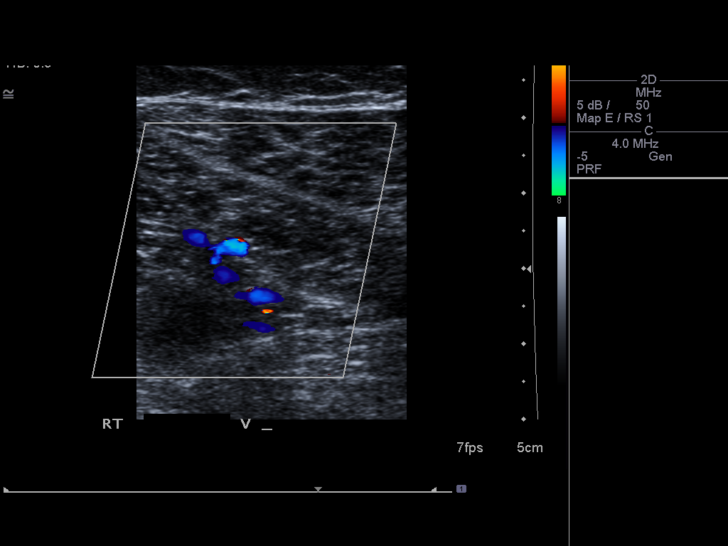
[im 21/40]
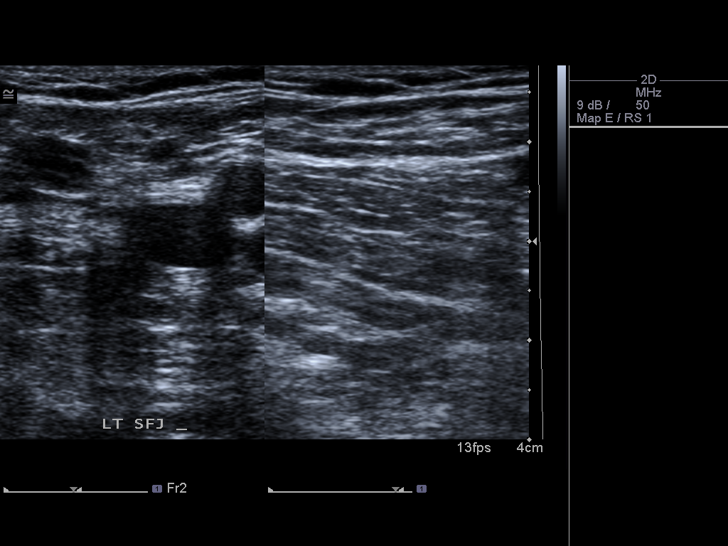
[im 24/40]
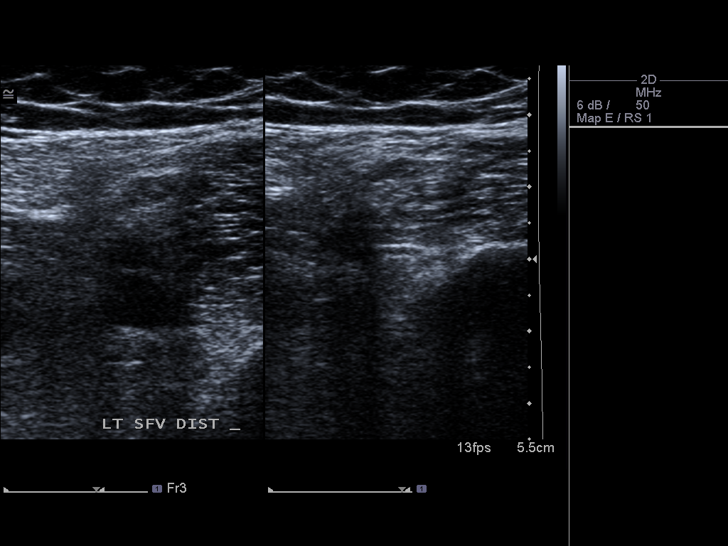
[im 28/40]
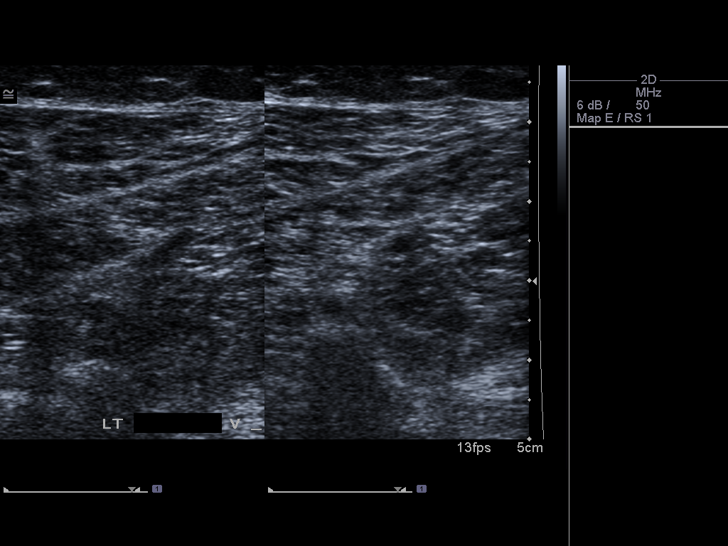
[im 31/40]
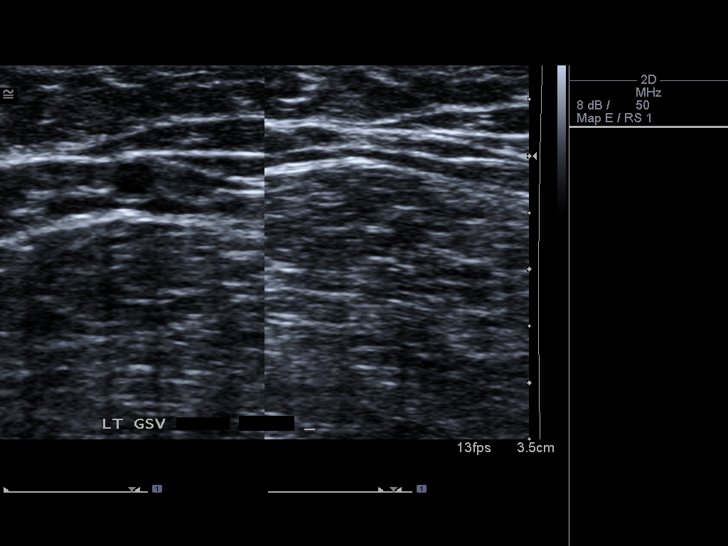
[im 33/40]
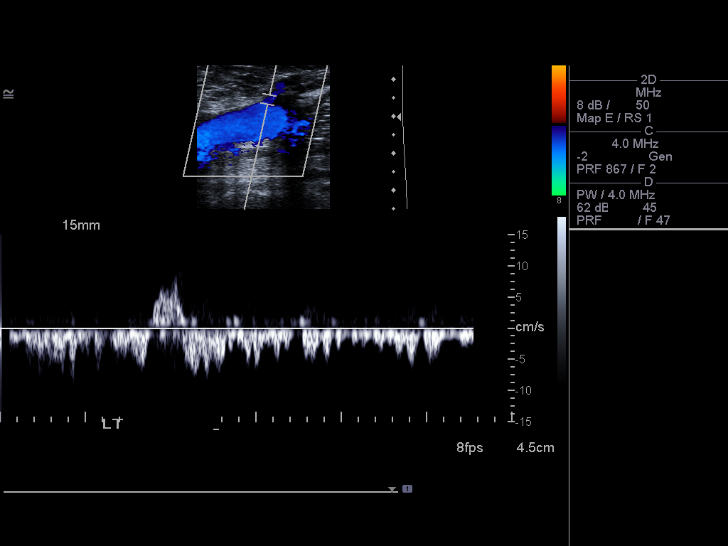
[im 36/40]
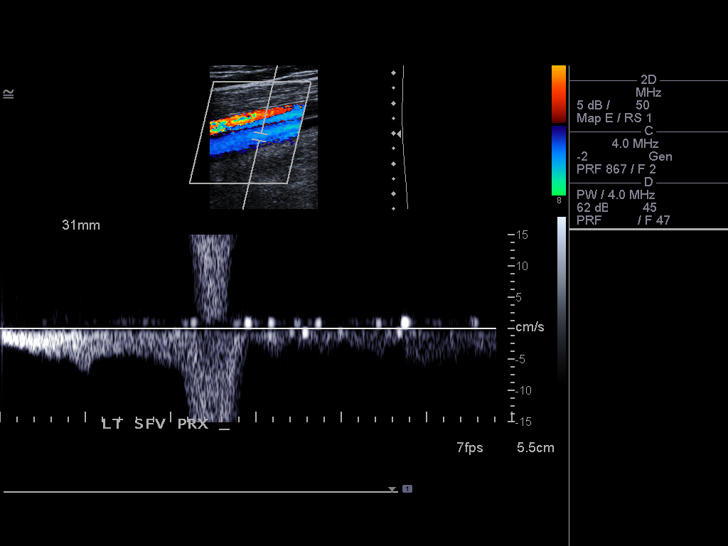
[im 40/40]
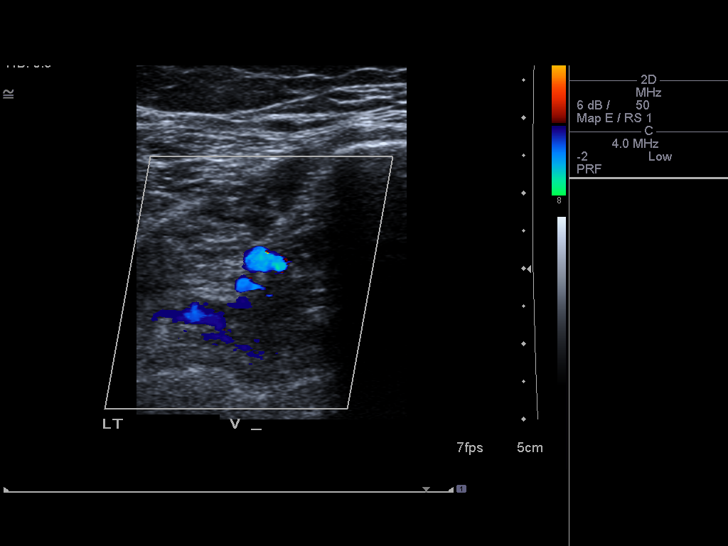

[14 of 24 positions shown; findings below may reference images not displayed]

FINDINGS: There is normal flow, compressibility and augmentation in
the common femoral, profunda femoral, femoral and popliteal veins
bilaterally.  Saphenous system is patent.  Visualized calf veins
are patent and compressible where visualized.
IMPRESSION: No evidence of deep venous thrombosis in the lower extremities
bilaterally.

## 2013-06-17 ENCOUNTER — Other Ambulatory Visit: Payer: Self-pay | Admitting: *Deleted

## 2013-06-17 DIAGNOSIS — G4701 Insomnia due to medical condition: Secondary | ICD-10-CM

## 2013-06-17 MED ORDER — ZOLPIDEM TARTRATE 10 MG PO TABS: 10.0000 mg | ORAL_TABLET | Freq: Every evening | ORAL | Status: DC | PRN

## 2013-07-11 ENCOUNTER — Ambulatory Visit: Payer: Self-pay | Admitting: Hematology & Oncology

## 2013-07-11 ENCOUNTER — Other Ambulatory Visit: Payer: Self-pay | Admitting: Lab

## 2013-07-25 ENCOUNTER — Telehealth: Payer: Self-pay | Admitting: Hematology & Oncology

## 2013-07-25 NOTE — Telephone Encounter (Signed)
Per Roxanne Gates and Md to sch patient for 08/01/13 due to patient missing 07/11/13 apt.  I called patient to give apt time change.  I did not get an answer so i left a message on Voice mail of change apt time

## 2013-08-01 ENCOUNTER — Encounter: Payer: Self-pay | Admitting: Family

## 2013-08-01 ENCOUNTER — Ambulatory Visit (HOSPITAL_BASED_OUTPATIENT_CLINIC_OR_DEPARTMENT_OTHER): Payer: Self-pay | Admitting: Family

## 2013-08-01 ENCOUNTER — Other Ambulatory Visit (HOSPITAL_BASED_OUTPATIENT_CLINIC_OR_DEPARTMENT_OTHER): Payer: Managed Care, Other (non HMO) | Admitting: Lab

## 2013-08-01 ENCOUNTER — Ambulatory Visit (HOSPITAL_BASED_OUTPATIENT_CLINIC_OR_DEPARTMENT_OTHER): Payer: Managed Care, Other (non HMO)

## 2013-08-01 VITALS — BP 108/77 | HR 80

## 2013-08-01 VITALS — BP 109/68 | HR 71 | Temp 98.2°F | Resp 14 | Ht 64.0 in | Wt 128.0 lb

## 2013-08-01 DIAGNOSIS — Z86711 Personal history of pulmonary embolism: Secondary | ICD-10-CM

## 2013-08-01 DIAGNOSIS — Z86718 Personal history of other venous thrombosis and embolism: Secondary | ICD-10-CM

## 2013-08-01 DIAGNOSIS — D45 Polycythemia vera: Secondary | ICD-10-CM

## 2013-08-01 DIAGNOSIS — Z7901 Long term (current) use of anticoagulants: Secondary | ICD-10-CM

## 2013-08-01 DIAGNOSIS — D6859 Other primary thrombophilia: Secondary | ICD-10-CM

## 2013-08-01 LAB — CBC WITH DIFFERENTIAL (CANCER CENTER ONLY)
BASO#: 0 10*3/uL (ref 0.0–0.2)
BASO%: 0.5 % (ref 0.0–2.0)
EOS%: 1.6 % (ref 0.0–7.0)
Eosinophils Absolute: 0.1 10*3/uL (ref 0.0–0.5)
HCT: 39 % (ref 34.8–46.6)
HGB: 13 g/dL (ref 11.6–15.9)
LYMPH#: 2.3 10*3/uL (ref 0.9–3.3)
LYMPH%: 40.3 % (ref 14.0–48.0)
MCH: 27.2 pg (ref 26.0–34.0)
MCHC: 33.3 g/dL (ref 32.0–36.0)
MCV: 82 fL (ref 81–101)
MONO#: 0.4 10*3/uL (ref 0.1–0.9)
MONO%: 6.8 % (ref 0.0–13.0)
NEUT#: 2.9 10*3/uL (ref 1.5–6.5)
NEUT%: 50.8 % (ref 39.6–80.0)
PLATELETS: 232 10*3/uL (ref 145–400)
RBC: 4.78 10*6/uL (ref 3.70–5.32)
RDW: 20 % — AB (ref 11.1–15.7)
WBC: 5.8 10*3/uL (ref 3.9–10.0)

## 2013-08-01 NOTE — Patient Instructions (Signed)

## 2013-08-01 NOTE — Progress Notes (Signed)
Holly Shaffer presents today for phlebotomy per MD orders. Phlebotomy procedure started at 0900 and ended at 0920. 500 grams removed. Patient observed for 30 minutes after procedure without any incident. Patient tolerated procedure well. IV needle removed intact.

## 2013-08-01 NOTE — Progress Notes (Signed)
[place]  [phone] Fax:(336) [phone]  ID: Dwan Bolt OB: 1970-05-15 MR#: [id number]#:[id number] Patient Care Team: No Pcp Per Patient as PCP - General (General Practice)  DIAGNOSIS: 1. 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  2. Autoimmune retinal vasculitis.  3. Recurrent deep venous thrombosis/pulmonary embolism -- heterozygote  for factor V Leiden mutation.  INTERVAL HISTORY:  Mrs. Presswood is a very pleasant 43 yo female here alone today for follow-up. She is feeling tired and bloated this week. She feels that she needs phlebotomized today. She is still having pain in her left thigh and groin from the blood clot she previously had. This is not new pian. She denies fever, chills, cough, rash, n/v, headache, dizziness, SOB, chest pain, palpitations, abdominal pain, constipation, diarrhea, problems urinating, blood in urine or stool. She continues to take her Xarelto and Asprin daily. Her Hgb today is 13.0. Her appetite is good.   CURRENT TREATMENT: 1. Phlebotomy to maintain hemoglobin less than 12.  2. Xarelto 20 mg p.o. daily.  3. Aspirin 81 mg p.o. daily.  REVIEW OF SYSTEMS: All other 10 point review of systems is negative.   PAST MEDICAL HISTORY: Past Medical History  Diagnosis Date  . Polycythemia vera(238.4) 01/26/2011  . DVT (deep venous thrombosis) 01/26/2011  . Retinal vasculitis 01/26/2011  . FH: factor V Leiden mutation 01/26/2011  . Irritable bowel disease unkown   PAST SURGICAL HISTORY: No past surgical history on file.  FAMILY HISTORY No family history on file.  GYNECOLOGIC HISTORY:  No LMP recorded. Patient is not currently having periods (Reason: IUD).   SOCIAL HISTORY:  History   Social History  . Marital Status: Legally Separated    Spouse Name: N/A    Number of Children: N/A  . Years of Education: N/A   Occupational History  . Not on file.   Social History Main Topics  . Smoking status: Current Every Day Smoker --  1.00 packs/day for 23 years    Types: Cigarettes    Start date: 02/11/1988  . Smokeless tobacco: Never Used     Comment: 05-15-13  still smoking- quitting info given x 2  . Alcohol Use: Not on file  . Drug Use: Not on file  . Sexual Activity: Not on file   Other Topics Concern  . Not on file   Social History Narrative  . No narrative on file   ADVANCED DIRECTIVES: <no information>  HEALTH MAINTENANCE: History  Substance Use Topics  . Smoking status: Current Every Day Smoker -- 1.00 packs/day for 23 years    Types: Cigarettes    Start date: 02/11/1988  . Smokeless tobacco: Never Used     Comment: 05-15-13  still smoking- quitting info given x 2  . Alcohol Use: Not on file   Colonoscopy: PAP: Bone density: Lipid panel:  Allergies  Allergen Reactions  . Bactrim Diarrhea and Nausea Only    Current Outpatient Prescriptions  Medication Sig Dispense Refill  . Famotidine (PEPCID AC PO) Take by mouth as needed.       Alveda Reasons 20 MG TABS tablet TAKE 20 MG (1 TABLET) BY MOUTH DAILY AT 12 NOON.  30 tablet  5  . zolpidem (AMBIEN) 10 MG tablet Take 1 tablet (10 mg total) by mouth at bedtime as needed for sleep.  30 tablet  3   No current facility-administered medications for this visit.   OBJECTIVE: Filed Vitals:   08/01/13 0902  BP: 109/68  Pulse: 71  Temp: 98.2 F (  36.8 C)  Resp: 14   Body mass index is 21.96 kg/(m^2). ECOG FS:0 - Asymptomatic Ocular: Sclerae unicteric, pupils equal, round and reactive to light Ear-nose-throat: Oropharynx clear, dentition fair Lymphatic: No cervical or supraclavicular adenopathy Lungs no rales or rhonchi, good excursion bilaterally Heart regular rate and rhythm, no murmur appreciated Abd soft, nontender, positive bowel sounds MSK no focal spinal tenderness, no joint edema Neuro: non-focal, well-oriented, appropriate affect Breasts: Deferred  LAB RESULTS: CMP  No results found for this basename: na, k, cl, co2, glucose, bun,  creatinine, calcium, prot, albumin, ast, alt, alkphos, bilitot, gfrnonaa, gfraa   No results found for this basename: SPEP, UPEP,  kappa and lambda light chains   Lab Results  Component Value Date   WBC 5.8 08/01/2013   NEUTROABS 2.9 08/01/2013   HGB 13.0 08/01/2013   HCT 39.0 08/01/2013   MCV 82 08/01/2013   PLT 232 08/01/2013   No results found for this basename: LABCA2   No components found with this basename: [id number]   No results found for this basename: INR,  in the last 168 hours  STUDIES: No results found  ASSESSMENT/PLAN: Ms. Ivins is 43 year old white female with history of polycythemia. She feels a lot better when her hemoglobin is this low, 10-12.  Today her Hgb is 13 and she is symptomatic. She will be phlebotomized today.   She does have a factor V Leiden mutation. She is heterozygous for this. She is a lifelong anticoagulation and continues to take her Xarelto and Asprin. She's had no problems since being on anticoagulation.   We will se her back in 2 months for labs and follow-up appointment.  All questions were answered and she is in agreement with the plan.   She knows to call here with any questions or concerns and to go to the ED in the event of an emergency. We can certainly see her sooner if need be.  Eliezer Bottom, NP 08/01/2013 9:33 AM

## 2013-08-15 ENCOUNTER — Other Ambulatory Visit: Payer: Self-pay | Admitting: Hematology & Oncology

## 2013-10-01 ENCOUNTER — Other Ambulatory Visit: Payer: Self-pay | Admitting: Lab

## 2013-10-01 ENCOUNTER — Ambulatory Visit: Payer: Self-pay | Admitting: Hematology & Oncology

## 2013-10-08 ENCOUNTER — Other Ambulatory Visit (HOSPITAL_BASED_OUTPATIENT_CLINIC_OR_DEPARTMENT_OTHER): Payer: Managed Care, Other (non HMO) | Admitting: Lab

## 2013-10-08 ENCOUNTER — Ambulatory Visit (HOSPITAL_BASED_OUTPATIENT_CLINIC_OR_DEPARTMENT_OTHER): Payer: Managed Care, Other (non HMO)

## 2013-10-08 ENCOUNTER — Ambulatory Visit (HOSPITAL_BASED_OUTPATIENT_CLINIC_OR_DEPARTMENT_OTHER): Payer: Managed Care, Other (non HMO) | Admitting: Family

## 2013-10-08 VITALS — BP 134/75 | HR 83 | Temp 99.1°F | Resp 16 | Ht 64.0 in | Wt 123.0 lb

## 2013-10-08 VITALS — BP 132/90 | HR 97

## 2013-10-08 DIAGNOSIS — I82402 Acute embolism and thrombosis of unspecified deep veins of left lower extremity: Secondary | ICD-10-CM

## 2013-10-08 DIAGNOSIS — Z86718 Personal history of other venous thrombosis and embolism: Secondary | ICD-10-CM

## 2013-10-08 DIAGNOSIS — H35069 Retinal vasculitis, unspecified eye: Secondary | ICD-10-CM

## 2013-10-08 DIAGNOSIS — D45 Polycythemia vera: Secondary | ICD-10-CM

## 2013-10-08 DIAGNOSIS — Z832 Family history of diseases of the blood and blood-forming organs and certain disorders involving the immune mechanism: Secondary | ICD-10-CM

## 2013-10-08 DIAGNOSIS — D6859 Other primary thrombophilia: Secondary | ICD-10-CM

## 2013-10-08 DIAGNOSIS — Z86711 Personal history of pulmonary embolism: Secondary | ICD-10-CM

## 2013-10-08 LAB — CBC WITH DIFFERENTIAL (CANCER CENTER ONLY)
BASO#: 0 10*3/uL (ref 0.0–0.2)
BASO%: 0.3 % (ref 0.0–2.0)
EOS%: 0.6 % (ref 0.0–7.0)
Eosinophils Absolute: 0.1 10*3/uL (ref 0.0–0.5)
HEMATOCRIT: 43.1 % (ref 34.8–46.6)
HEMOGLOBIN: 14.6 g/dL (ref 11.6–15.9)
LYMPH#: 2.3 10*3/uL (ref 0.9–3.3)
LYMPH%: 23.2 % (ref 14.0–48.0)
MCH: 29.8 pg (ref 26.0–34.0)
MCHC: 33.9 g/dL (ref 32.0–36.0)
MCV: 88 fL (ref 81–101)
MONO#: 0.7 10*3/uL (ref 0.1–0.9)
MONO%: 7.2 % (ref 0.0–13.0)
NEUT%: 68.7 % (ref 39.6–80.0)
NEUTROS ABS: 6.9 10*3/uL — AB (ref 1.5–6.5)
Platelets: 246 10*3/uL (ref 145–400)
RBC: 4.9 10*6/uL (ref 3.70–5.32)
RDW: 16.7 % — AB (ref 11.1–15.7)
WBC: 10 10*3/uL (ref 3.9–10.0)

## 2013-10-08 NOTE — Progress Notes (Signed)
Holly Shaffer presents today for phlebotomy per MD orders. Phlebotomy procedure started at 1550 and ended at 1410. Approximately 500 mls removed. Patient observed for 30 minutes after procedure without any incident. Patient tolerated procedure well. IV needle removed intact.

## 2013-10-08 NOTE — Patient Instructions (Signed)

## 2013-10-08 NOTE — Progress Notes (Signed)
[place]  Telephone:(336) [phone] Fax:(336) [phone]  ID: Holly Shaffer OB: 1970/03/22 MR#: [id number]#:[id number] Patient Care Team: No Pcp Per Patient as PCP - General (General Practice)  DIAGNOSIS: 1. 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  2. Autoimmune retinal vasculitis.  3. Recurrent deep venous thrombosis/pulmonary embolism -- heterozygote  for factor V Leiden mutation.  INTERVAL HISTORY: Holly Shaffer is here today for follow-up. She is feeling better but says that she has aches all over at times. Her leg pain has improved. She denies fever, chills, cough, rash, n/v, headache, dizziness, SOB, chest pain, palpitations, abdominal pain, constipation, diarrhea, problems urinating, blood in urine or stool. She continues to take her Xarelto and Asprin daily. Her Hgb today is 14.6. Her appetite is good and she is drinking plenty of fluids. Her main issues this visit is she is having a hard time getting here all the way from [place]. She is working a new job and doesn't have [place] and has to make up her time. This is hard for her. She is going to be finding a Dr. Within her network closer to home. We told her this was fine and to let us know if we could help and when/where to send her records.   CURRENT TREATMENT: 1. Phlebotomy to maintain hemoglobin less than 12.  2. Xarelto 20 mg p.o. daily.  3. Aspirin 81 mg p.o. daily.  REVIEW OF SYSTEMS: All other 10 point review of systems is negative.   PAST MEDICAL HISTORY: Past Medical History  Diagnosis Date  . Polycythemia vera(238.4) 01/26/2011  . DVT (deep venous thrombosis) 01/26/2011  . Retinal vasculitis 01/26/2011  . FH: factor V Leiden mutation 01/26/2011  . Irritable bowel disease unkown   PAST SURGICAL HISTORY: No past surgical history on file.  FAMILY HISTORY No family history on file.  GYNECOLOGIC HISTORY:  No LMP recorded. Patient is not currently having periods (Reason: IUD).   SOCIAL HISTORY:  History    Social History  . Marital Status: Legally Separated    Spouse Name: N/A    Number of Children: N/A  . Years of Education: N/A   Occupational History  . Not on file.   Social History Main Topics  . Smoking status: Current Every Day Smoker -- 1.00 packs/day for 23 years    Types: Cigarettes    Start date: 02/11/1988  . Smokeless tobacco: Never Used     Comment: 05-15-13  still smoking- quitting info given x 2  . Alcohol Use: Not on file  . Drug Use: Not on file  . Sexual Activity: Not on file   Other Topics Concern  . Not on file   Social History Narrative  . No narrative on file   ADVANCED DIRECTIVES: <no information>  HEALTH MAINTENANCE: History  Substance Use Topics  . Smoking status: Current Every Day Smoker -- 1.00 packs/day for 23 years    Types: Cigarettes    Start date: 02/11/1988  . Smokeless tobacco: Never Used     Comment: 05-15-13  still smoking- quitting info given x 2  . Alcohol Use: Not on file   Colonoscopy: PAP: Bone density: Lipid panel:  Allergies  Allergen Reactions  . Bactrim Diarrhea and Nausea Only    Current Outpatient Prescriptions  Medication Sig Dispense Refill  . Famotidine (PEPCID AC PO) Take by mouth as needed.       Alveda Reasons 20 MG TABS tablet TAKE 1 TABLET BY MOUTH DAILY AT NOON  30 tablet  5  .  zolpidem (AMBIEN) 10 MG tablet Take 1 tablet (10 mg total) by mouth at bedtime as needed for sleep.  30 tablet  3   No current facility-administered medications for this visit.   OBJECTIVE: Filed Vitals:   10/08/13 1508  BP: 134/75  Pulse: 83  Temp: 99.1 F (37.3 C)  Resp: 16   Body mass index is 21.1 kg/(m^2). ECOG FS:1 - Symptomatic but completely ambulatory Ocular: Sclerae unicteric, pupils equal, round and reactive to light Ear-nose-throat: Oropharynx clear, dentition fair Lymphatic: No cervical or supraclavicular adenopathy Lungs no rales or rhonchi, good excursion bilaterally Heart regular rate and rhythm, no murmur  appreciated Abd soft, nontender, positive bowel sounds MSK no focal spinal tenderness, no joint edema Neuro: non-focal, well-oriented, appropriate affect Breasts: Deferred  LAB RESULTS: CMP  No results found for this basename: na,  k,  cl,  co2,  glucose,  bun,  creatinine,  calcium,  prot,  albumin,  ast,  alt,  alkphos,  bilitot,  gfrnonaa,  gfraa   No results found for this basename: SPEP,  UPEP,   kappa and lambda light chains   Lab Results  Component Value Date   WBC 10.0 10/08/2013   NEUTROABS 6.9* 10/08/2013   HGB 14.6 10/08/2013   HCT 43.1 10/08/2013   MCV 88 10/08/2013   PLT 246 10/08/2013   No results found for this basename: LABCA2   No components found with this basename: [id number]   No results found for this basename: INR,  in the last 168 hours  STUDIES: No results found  ASSESSMENT/PLAN: Ms. Shaffer is 43 year old white female with history of polycythemia. She feels a lot better when her hemoglobin is this low, 10-12.  Today her Hgb is 14.6 and she is symptomatic. She will be phlebotomized today.   She does have a factor V Leiden mutation. She is heterozygous for this. She is a lifelong anticoagulation and continues to take her Xarelto and Asprin. She's had no problems since being on anticoagulation.   She will let us know when she gets in with her new hematologist where to send her records.   All questions were answered and she is in agreement with the plan.   She knows to call here with any questions or concerns and to go to the ED in the event of an emergency. We can certainly keep seeing her if she is unable to find a new hematologist.  Eliezer Bottom, NP 10/08/2013 4:38 PM

## 2013-11-15 ENCOUNTER — Other Ambulatory Visit: Payer: Self-pay | Admitting: Hematology & Oncology

## 2013-11-19 ENCOUNTER — Telehealth: Payer: Self-pay | Admitting: Hematology & Oncology

## 2013-11-19 NOTE — Telephone Encounter (Signed)
Pt called needs referral to [facility]. RN aware

## 2013-11-21 ENCOUNTER — Other Ambulatory Visit: Payer: Self-pay | Admitting: Hematology & Oncology

## 2013-11-21 ENCOUNTER — Telehealth: Payer: Self-pay | Admitting: Hematology & Oncology

## 2013-11-21 DIAGNOSIS — D45 Polycythemia vera: Secondary | ICD-10-CM

## 2013-11-21 NOTE — Telephone Encounter (Signed)
Pt called scheduled 11-10 lab, MD aware and will put in orders

## 2013-11-25 ENCOUNTER — Other Ambulatory Visit: Payer: Managed Care, Other (non HMO) | Admitting: Lab

## 2013-11-25 ENCOUNTER — Telehealth: Payer: Self-pay | Admitting: Hematology & Oncology
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
## 2013-12-05 ENCOUNTER — Other Ambulatory Visit (HOSPITAL_BASED_OUTPATIENT_CLINIC_OR_DEPARTMENT_OTHER): Payer: Managed Care, Other (non HMO) | Admitting: Lab

## 2013-12-05 ENCOUNTER — Ambulatory Visit (HOSPITAL_BASED_OUTPATIENT_CLINIC_OR_DEPARTMENT_OTHER): Payer: Managed Care, Other (non HMO)

## 2013-12-05 ENCOUNTER — Other Ambulatory Visit: Payer: Self-pay | Admitting: *Deleted

## 2013-12-05 VITALS — BP 112/74 | HR 76 | Temp 98.4°F | Resp 16

## 2013-12-05 DIAGNOSIS — D45 Polycythemia vera: Secondary | ICD-10-CM

## 2013-12-05 LAB — CBC WITH DIFFERENTIAL (CANCER CENTER ONLY)
BASO#: 0 10*3/uL (ref 0.0–0.2)
BASO%: 0.1 % (ref 0.0–2.0)
EOS ABS: 0.1 10*3/uL (ref 0.0–0.5)
EOS%: 0.7 % (ref 0.0–7.0)
HEMATOCRIT: 38.5 % (ref 34.8–46.6)
HEMOGLOBIN: 12.9 g/dL (ref 11.6–15.9)
LYMPH#: 1.8 10*3/uL (ref 0.9–3.3)
LYMPH%: 20.7 % (ref 14.0–48.0)
MCH: 29.5 pg (ref 26.0–34.0)
MCHC: 33.5 g/dL (ref 32.0–36.0)
MCV: 88 fL (ref 81–101)
MONO#: 0.6 10*3/uL (ref 0.1–0.9)
MONO%: 6.2 % (ref 0.0–13.0)
NEUT#: 6.4 10*3/uL (ref 1.5–6.5)
NEUT%: 72.3 % (ref 39.6–80.0)
Platelets: 227 10*3/uL (ref 145–400)
RBC: 4.38 10*6/uL (ref 3.70–5.32)
RDW: 15.2 % (ref 11.1–15.7)
WBC: 8.9 10*3/uL (ref 3.9–10.0)

## 2013-12-05 MED ORDER — ZOLPIDEM TARTRATE 10 MG PO TABS: 10.0000 mg | ORAL_TABLET | Freq: Every evening | ORAL | Status: AC | PRN

## 2013-12-05 NOTE — Patient Instructions (Signed)

## 2013-12-05 NOTE — Progress Notes (Signed)
Holly Shaffer presents today for phlebotomy per MD orders. Phlebotomy procedure started at 1350 and ended at 1400. 547mL removed. Patient observed for 30 minutes after procedure without any incident. Patient tolerated procedure well. IV needle removed intact.

## 2013-12-10 ENCOUNTER — Telehealth: Payer: Self-pay | Admitting: Hematology & Oncology

## 2013-12-10 NOTE — Telephone Encounter (Signed)
Pt in ofc today to sign auth release to have medical records sent to:  [facility] [place], [place] [address] [phone] (p)  [phone] (f)   [facility] Hematology Oncology

## 2014-02-28 ENCOUNTER — Other Ambulatory Visit: Payer: Self-pay | Admitting: Hematology & Oncology

## 2014-08-22 ENCOUNTER — Other Ambulatory Visit: Payer: Self-pay | Admitting: Hematology & Oncology

## 2014-09-19 ENCOUNTER — Other Ambulatory Visit: Payer: Self-pay | Admitting: Hematology & Oncology

## 2014-10-19 ENCOUNTER — Other Ambulatory Visit: Payer: Self-pay | Admitting: Hematology & Oncology

## 2015-11-09 ENCOUNTER — Other Ambulatory Visit: Payer: Self-pay | Admitting: Family
